# Patient Record
Sex: Female | Born: 2000
Health system: Southern US, Community
[De-identification: ages and names within clinical notes are randomized; demographics above are authoritative.]

## PROBLEM LIST (undated history)

## (undated) DIAGNOSIS — T50902A Poisoning by unspecified drugs, medicaments and biological substances, intentional self-harm, initial encounter: Secondary | ICD-10-CM

## (undated) DIAGNOSIS — T8859XA Other complications of anesthesia, initial encounter: Secondary | ICD-10-CM

## (undated) DIAGNOSIS — F32A Depression, unspecified: Secondary | ICD-10-CM

## (undated) DIAGNOSIS — F329 Major depressive disorder, single episode, unspecified: Secondary | ICD-10-CM

## (undated) DIAGNOSIS — I10 Essential (primary) hypertension: Secondary | ICD-10-CM

## (undated) HISTORY — PX: EYE SURGERY: SHX253

## (undated) HISTORY — DX: Poisoning by unspecified drugs, medicaments and biological substances, intentional self-harm, initial encounter: T50.902A

---
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
## 2019-01-01 ENCOUNTER — Other Ambulatory Visit: Payer: Self-pay | Admitting: Pediatrics

## 2019-01-02 NOTE — Telephone Encounter (Signed)
Patient needs a chest x-ray s/p positive Quantiferon Gold. Rx can not be refilled until x-ray is completed. Order is in so patient just needs to walk-in for x-ray. Left generic VM to call [facility] with a message from Dr. Konrad Dolores.

## 2019-01-03 ENCOUNTER — Other Ambulatory Visit: Payer: Self-pay | Admitting: Pediatrics

## 2019-01-03 DIAGNOSIS — F331 Major depressive disorder, recurrent, moderate: Secondary | ICD-10-CM

## 2019-01-03 NOTE — Telephone Encounter (Signed)
Second message left for patient to call [facility]. 

## 2019-01-06 NOTE — Telephone Encounter (Signed)
Contacted patient to remind her she needed a chest x-ray. Explained that order was on file and that she could walk in any time.

## 2019-01-09 NOTE — Telephone Encounter (Signed)
appt made for tomorrow. Patient also going to Xray before visit.

## 2019-01-10 ENCOUNTER — Ambulatory Visit
Admission: RE | Admit: 2019-01-10 | Discharge: 2019-01-10 | Disposition: A | Discharge status: Home / Self Care | Payer: Medicaid Other | Source: Ambulatory Visit | Attending: Pediatrics | Admitting: Pediatrics

## 2019-01-10 ENCOUNTER — Other Ambulatory Visit: Payer: Self-pay | Admitting: Pediatrics

## 2019-01-10 ENCOUNTER — Ambulatory Visit (INDEPENDENT_AMBULATORY_CARE_PROVIDER_SITE_OTHER): Payer: Medicaid Other | Admitting: Pediatrics

## 2019-01-10 ENCOUNTER — Other Ambulatory Visit: Payer: Self-pay

## 2019-01-10 ENCOUNTER — Encounter: Payer: Self-pay | Admitting: Pediatrics

## 2019-01-10 ENCOUNTER — Ambulatory Visit (INDEPENDENT_AMBULATORY_CARE_PROVIDER_SITE_OTHER): Payer: Medicaid Other | Admitting: Licensed Clinical Social Worker

## 2019-01-10 VITALS — Temp 98.3°F | Wt 134.4 lb

## 2019-01-10 DIAGNOSIS — R7611 Nonspecific reaction to tuberculin skin test without active tuberculosis: Secondary | ICD-10-CM

## 2019-01-10 DIAGNOSIS — F331 Major depressive disorder, recurrent, moderate: Secondary | ICD-10-CM | POA: Diagnosis not present

## 2019-01-10 DIAGNOSIS — F4310 Post-traumatic stress disorder, unspecified: Secondary | ICD-10-CM

## 2019-01-10 DIAGNOSIS — Z111 Encounter for screening for respiratory tuberculosis: Secondary | ICD-10-CM

## 2019-01-10 DIAGNOSIS — Z3202 Encounter for pregnancy test, result negative: Secondary | ICD-10-CM

## 2019-01-10 DIAGNOSIS — R0602 Shortness of breath: Secondary | ICD-10-CM | POA: Diagnosis not present

## 2019-01-10 LAB — POCT URINE PREGNANCY: Preg Test, Ur: NEGATIVE

## 2019-01-10 MED ORDER — DIVALPROEX SODIUM 500 MG PO DR TAB: 500.0000 mg | DELAYED_RELEASE_TABLET | Freq: Every day | ORAL | 0 refills | Status: DC

## 2019-01-10 NOTE — BH Specialist Note (Signed)
Integrated Behavioral Health Follow Up Visit  MRN: [id number] Name: Betty Logan  Number of Integrated Behavioral Health Clinician visits: 2/6 Session Start time: 2:17  Session End time: 2:24 Total time: 7 mins, no charge due to brief visit  Type of Service: Integrated Behavioral Health- Individual/Family Interpretor:No. Interpretor Name and Language: n/a  SUBJECTIVE: Betty Logan is a 18 y.o. female accompanied by Partner/Significant Other Patient was referred by Dr. Konrad Dolores for ongoing mood and behavior concerns. Patient reports the following symptoms/concerns: Pt reports interest in getting connected to [facility] for OPT and med mgmt Duration of problem: ongoing; Severity of problem: moderate  OBJECTIVE: Mood: Depressed, Euthymic and Irritable and Affect: Appropriate Risk of harm to self or others: No plan to harm self or others   GOALS ADDRESSED: 1.  Demonstrate ability to: Increase adequate support systems for patient/family  INTERVENTIONS: Interventions utilized:  Supportive Counseling and Link to [facility] Standardized Assessments completed: Not Needed  ASSESSMENT: Patient currently experiencing ongoing mood concerns, hx of counseling and med mgmt as effective.   Patient may benefit from keeping initial appt w/ [facility] for 01/13/2019 at noon.  PLAN: 1. Follow up with behavioral health clinician on : 02/10/2019 joint f/u w/ PCP 2. Behavioral recommendations: Pt will keep initial appt w/ [facility] to get established for counseling and med mgmt 3. Referral(s): Paramedic (LME/Outside Clinic); [facility], initial appt scheduled for 01/13/2019 4. "From scale of 1-10, how likely are you to follow plan?": Pt voiced understanding and agreement  [name], LPCA

## 2019-01-10 NOTE — Progress Notes (Signed)
Subjective:    Betty Logan is a 18 y.o. old female here with her boyfriend for follow-up positive TB test and anxiety/PTSD.    Alternate contact person for all health information including confidential results: cousin Betty Logan [phone] - ok to leave VM with confidential  HPI Betty Logan was seen for her annual PE on 11/25/18 and was restarted on depakote and prazosin which she had previously taken for anxiety and PTSD.  Patient reports that she has about 2-3 of the depakote left.  She thinks that she might have appointment upcoming with the [facility] soon but she isn't sure since her aunt was helping her set up those appointments.    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  Referred to health department for further testing.  Betty Logan reports that she has not received at call from the health department about her positive PPD test.      Shortness of breath - Doing better since the last visit.  Using albuterol and flovent prn.  Last used flovent last week for shortness of breath and feels like it helped. Does not have a spacer.  No cough.  Social history - Lives with boyfriend.  Previously staying with aunt and going to school.  Now looking for a job and not in school per patient  Review of Systems  Constitutional: Negative for activity change, appetite change and fever.  Respiratory: Positive for shortness of breath (much less than before). Negative for cough.     History and Problem List: Betty Logan has MDD (major depressive disorder), recurrent episode, moderate (HCC); Oppositional defiant disorder; Post traumatic stress disorder (PTSD); and Nightmares on their problem list.  Betty Logan  has a past medical history of Deliberate medication overdose (HCC) and Depression.     Objective:    Temp 98.3 F (36.8 C) (Temporal)   Wt 134 lb 6 oz (61 kg)   BMI 21.36 kg/m  Physical Exam Vitals signs reviewed.  HENT:     Mouth/Throat:     Mouth: Mucous membranes are moist.     Pharynx:  Oropharynx is clear.  Cardiovascular:     Rate and Rhythm: Normal rate and regular rhythm.     Heart sounds: Normal heart sounds.  Pulmonary:     Effort: Pulmonary effort is normal.     Breath sounds: Normal breath sounds. No wheezing, rhonchi or rales.  Neurological:     Mental Status: She is alert.        Assessment and Plan:   Betty Logan is a 18 y.o. old female with  1. Shortness of breath Improved.  Reviewed different between controller medication (Flovent) and rescue medication (Albuterol) for symptoms. Shortness of breath may have been due to asthma symptoms vs. Anxiety.  Pulmonary exam and chest x-ray are normal today.  2. Pregnancy examination or test, negative result Patient reports that she is taking the OCPs that were prescribed at the last visit. - POCT urine pregnancy  3. MDD (major depressive disorder) and PTSD Temporary 1 month supply of depakote provided to bridge her to her psychiatry appointment.  She has an intake appointment with the [facility] on 01/13/2019 but it is not clear when she will see psychiatry there for medication maanagement.  Discussed with patient that I will be able to provide only temporary Rx until she is able to establish with psychiatry.    - divalproex (DEPAKOTE) 500 MG DR tablet; Take 1 tablet (500 mg total) by mouth daily.  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  Positive quantiferon gold may be false positive vs. Indicative of latent TB.  Patient has not been contacted by the health department regarding this result.  Her phone only works on [employer] and she does not have the option to listen to voicemails.  She consents to have her cousin Betty Logan listed as an alternate contact - all health information may be shared with this cousin per patient. Betty Logan's phone number is [phone]     Return for recheck breathing and medications in 1 month with Dr. Konrad Dolores. Patient  may cancel appointment is she is able to establish with psychiatry for med management within the next month.  Clifton Custard, MD

## 2019-01-11 DIAGNOSIS — R0602 Shortness of breath: Secondary | ICD-10-CM | POA: Insufficient documentation

## 2019-01-13 ENCOUNTER — Telehealth: Payer: Self-pay

## 2019-01-13 NOTE — Telephone Encounter (Signed)
-----   Message from Clifton Custard, MD sent at 01/11/2019  9:08 AM EST ----- Patient gave cousin [name] [phone] as alternate contact for health department.  Ok to share all health information with [name] per patient.  Please contact health department with this new information.  Patient's chest x-ray was normal

## 2019-01-13 NOTE — Telephone Encounter (Signed)
Faxed cousin's name and number, along with copy of CXR result to TB clinic at [phone].

## 2019-01-27 ENCOUNTER — Telehealth: Payer: Self-pay

## 2019-01-27 NOTE — Telephone Encounter (Signed)
Betty Logan is returning a call related to treatment for TB. No record of outgoing call regarding this. Suspect call originated from [facility]. Betty Logan thinks this may be the case as well. Gave her number for TB clinic. She stated she would call the health department.

## 2019-02-10 ENCOUNTER — Encounter: Payer: Self-pay | Admitting: Licensed Clinical Social Worker

## 2019-02-10 ENCOUNTER — Ambulatory Visit: Payer: Medicaid Other | Admitting: Pediatrics

## 2019-02-24 ENCOUNTER — Other Ambulatory Visit: Payer: Self-pay | Admitting: Pediatrics

## 2019-02-24 ENCOUNTER — Telehealth: Payer: Self-pay | Admitting: Licensed Clinical Social Worker

## 2019-02-24 DIAGNOSIS — F331 Major depressive disorder, recurrent, moderate: Secondary | ICD-10-CM

## 2019-02-24 DIAGNOSIS — F4310 Post-traumatic stress disorder, unspecified: Secondary | ICD-10-CM

## 2019-02-24 NOTE — Progress Notes (Signed)
Will rout to correct pod, blue RX.  

## 2019-02-24 NOTE — Telephone Encounter (Signed)
[facility] called [facility] at request of PCP. No answer, no option to leave a voicemail.  [facility] called pt at request of PCP. Pt is requesting refills of depakote and inhaler. Pt reports having not gotten connected w/ [facility], is not interested in therapy. Pt agreed to doing virtual visits w/ [facility]. [facility] and pt scheduled test visit to determine pt's access.   Pt also reports being interested in coming to the clinic for a depo shot, does not want to take birth control pill.  [facility] to call pt 02/25/2019 in pm after discussing options w/ pt's PCP

## 2019-02-27 ENCOUNTER — Other Ambulatory Visit: Payer: Self-pay | Admitting: Pediatrics

## 2019-02-27 ENCOUNTER — Other Ambulatory Visit: Payer: Self-pay

## 2019-02-27 ENCOUNTER — Ambulatory Visit (INDEPENDENT_AMBULATORY_CARE_PROVIDER_SITE_OTHER): Payer: Medicaid Other | Admitting: Licensed Clinical Social Worker

## 2019-02-27 DIAGNOSIS — F331 Major depressive disorder, recurrent, moderate: Secondary | ICD-10-CM | POA: Diagnosis not present

## 2019-02-27 DIAGNOSIS — F4310 Post-traumatic stress disorder, unspecified: Secondary | ICD-10-CM

## 2019-02-27 DIAGNOSIS — Z79899 Other long term (current) drug therapy: Secondary | ICD-10-CM

## 2019-02-27 MED ORDER — MEDROXYPROGESTERONE ACETATE 150 MG/ML IM SUSP: 150.0000 mg | Freq: Once | INTRAMUSCULAR | Status: DC

## 2019-02-27 MED ORDER — DIVALPROEX SODIUM 500 MG PO DR TAB: 500.0000 mg | DELAYED_RELEASE_TABLET | Freq: Every day | ORAL | 0 refills | Status: DC

## 2019-02-27 NOTE — BH Specialist Note (Signed)
Integrated Behavioral Health via Telemedicine Video Visit  02/27/2019 Betty Logan [id number]  Session Start time: 3:39  Session End time: 4:20 Total time: 41 mins  Referring Provider: Dr. Konrad Dolores Type of Visit: Video Patient/Family location: Aunt's house [facility] Provider location: [facility] All persons participating in visit: Pt, [facility] and [name]  Confirmed patient's address: Yes  Confirmed patient's phone number: Yes  Any changes to demographics: No   Confirmed patient's insurance: Yes  Any changes to patient's insurance: No   Discussed confidentiality: Yes   I connected with Betty Logan by a video enabled telemedicine application and verified that I am speaking with the correct person using two identifiers.     I discussed the limitations of evaluation and management by telemedicine and the availability of in person appointments.  I discussed that the purpose of this visit is to provide behavioral health care while limiting exposure to the novel coronavirus.   Discussed there is a possibility of technology failure and discussed alternative modes of communication if that failure occurs.  I discussed that engaging in this video visit, they consent to the provision of behavioral healthcare and the services will be billed under their insurance.  Patient and/or legal guardian expressed understanding and consented to video visit: Yes   PRESENTING CONCERNS: Patient and/or family reports the following symptoms/concerns: Pt reports that the depacote has helped reduce her mood concerns and nightmares. Pt reports feeling more stable in her mood. Pt also reports feeling sad at the upcoming anniversary of her mother's death. Duration of problem: ongoing mood concerns; Severity of problem: moderate  STRENGTHS (Protective Factors/Coping Skills): Pt is insightful and aware of mood difficulties Pt as created supportive family network around her  GOALS ADDRESSED: Patient will: 1.   Increase knowledge and/or ability of: coping skills, healthy habits and self-management skills  2.  Demonstrate ability to: Increase healthy adjustment to current life circumstances  INTERVENTIONS: Interventions utilized:  Solution-Focused Strategies, Mindfulness or Management consultant, Supportive Counseling and Psychoeducation and/or Health Education Standardized Assessments completed: None at this time, PHQ-SADS indicated at follow up  ASSESSMENT: Patient currently experiencing ongoing mood concerns, as well as hx of nightmares. Pt also experiencing hx of mental health concerns, to include previous SI and time spent in [facility] and group homes. Pt denies any SI at this time. Pt experiencing the upcoming anniversary of her mother's death, is feeling sadness about losing mom.   Patient may benefit from ongoing support and coping skills from this clinic. Pt may also benefit from implementing coping strategies when emotions get overwhelmed. Pt may also benefit from following up w/ PCP and getting appropriate lab work done in order to maintain rx.  PLAN: 1. Follow up with behavioral health clinician on : 03/06/2019 2. Behavioral recommendations: Pt will take a walk or a hot shower when feeling upset. Pt will have labwork done in clinic 02/28/2019 3. Referral(s): Integrated [employer] (In Clinic)  I discussed the assessment and treatment plan with the patient and/or parent/guardian. They were provided an opportunity to ask questions and all were answered. They agreed with the plan and demonstrated an understanding of the instructions.   They were advised to call back or seek an in-person evaluation if the symptoms worsen or if the condition fails to improve as anticipated.  [name]

## 2019-02-28 ENCOUNTER — Ambulatory Visit: Payer: Medicaid Other

## 2019-02-28 NOTE — Progress Notes (Deleted)
Patient cancelled appointment.

## 2019-03-04 ENCOUNTER — Telehealth: Payer: Self-pay | Admitting: Licensed Clinical Social Worker

## 2019-03-04 NOTE — Telephone Encounter (Signed)
[facility] called pt to follow up on missed lab appt. Pt reports not having been able to find medicaid card for appt. [facility] and pt discussed community agencies to help support her in that. [facility] and pt spoke about rescheduling f/u [facility] appt. Appt changed to 03/05/2019, pt in agreement with no further questions or concerns.

## 2019-03-05 ENCOUNTER — Ambulatory Visit (INDEPENDENT_AMBULATORY_CARE_PROVIDER_SITE_OTHER): Payer: Medicaid Other | Admitting: Licensed Clinical Social Worker

## 2019-03-05 ENCOUNTER — Other Ambulatory Visit: Payer: Self-pay

## 2019-03-05 DIAGNOSIS — F331 Major depressive disorder, recurrent, moderate: Secondary | ICD-10-CM | POA: Diagnosis not present

## 2019-03-05 NOTE — BH Specialist Note (Signed)
Integrated Behavioral Health via Telemedicine Video Visit  03/05/2019 Betty Logan [id number]  Session Start time: 4:58  Session End time: 4:18 Total time: 20 minutes  Referring Provider: Dr. Konrad Logan Type of Visit: Video Patient/Family location: Sister's car [facility] Provider location: [facility] All persons participating in visit: Pt and [facility]  Confirmed patient's address: Yes  Confirmed patient's phone number: Yes  Any changes to demographics: No   Confirmed patient's insurance: Yes  Any changes to patient's insurance: No, pt reports having misplaced her medicaid card, a temporary card is in the mail.  Discussed confidentiality: Yes   I connected withShauntel J Logan by a video enabled telemedicine application and verified that I am speaking with the correct person using two identifiers.     I discussed the limitations of evaluation and management by telemedicine and the availability of in person appointments.  I discussed that the purpose of this visit is to provide behavioral health care while limiting exposure to the novel coronavirus.   Discussed there is a possibility of technology failure and discussed alternative modes of communication if that failure occurs.  I discussed that engaging in this video visit, they consent to the provision of behavioral healthcare and the services will be billed under their insurance.  Patient and/or legal guardian expressed understanding and consented to video visit: Yes   PRESENTING CONCERNS: Patient and/or family reports the following symptoms/concerns: Pt reports having recently gotten a job, feels excited and productive to start working. Pt reports spending more time with her sisters, feels supportive. Pt reports not thinking about upcoming anniversary of mom's death. Pt reports having cut her hair to help prevent her from pulling it out when anxious, feels positively about change Duration of problem: ongoing mood concerns; Severity of problem:  moderate  STRENGTHS (Protective Factors/Coping Skills): Insight into difficulties w/ mood Pt has created a supportive family network around her Pt starting a new job  GOALS ADDRESSED: Patient will: 1.  Increase knowledge and/or ability of: coping skills, healthy habits and self-management skills  2.  Demonstrate ability to: Increase healthy adjustment to current life circumstances  INTERVENTIONS: Interventions utilized:  Brief CBT, Supportive Counseling and Psychoeducation and/or Health Education Standardized Assessments completed: None at this time, PHQ-SADS indicated at follow up  ASSESSMENT: Patient currently experiencing ongoing mood concerns, as well as hx of nightmares. Pt w/ hx of mental health concerns; no SI at this time. Pt experiencing excitement and anticipation of starting a new job. Pt w/ hx of anxiety, has made changes to help reduce anxiety reactions.   Patient may benefit from ongoing support from this clinic. PT may also benefit from continuing to implement coping strategies when upset, as well as continue to spend time w/ supportive relationships.  PLAN: 1. Follow up with behavioral health clinician on : 03/12/2019 2. Behavioral recommendations: Pt will continue to spend time w/ sisters and will use coping skills when upset 3. Referral(s): Integrated [employer] (In Clinic)  I discussed the assessment and treatment plan with the patient and/or parent/guardian. They were provided an opportunity to ask questions and all were answered. They agreed with the plan and demonstrated an understanding of the instructions.   They were advised to call back or seek an in-person evaluation if the symptoms worsen or if the condition fails to improve as anticipated.  Betty Logan

## 2019-03-06 ENCOUNTER — Ambulatory Visit: Payer: Medicaid Other | Admitting: Licensed Clinical Social Worker

## 2019-03-12 ENCOUNTER — Ambulatory Visit: Payer: Medicaid Other | Admitting: Licensed Clinical Social Worker

## 2019-03-12 ENCOUNTER — Other Ambulatory Visit: Payer: Self-pay

## 2019-03-14 ENCOUNTER — Ambulatory Visit (HOSPITAL_COMMUNITY)
Admission: EM | Admit: 2019-03-14 | Discharge: 2019-03-14 | Disposition: A | Discharge status: Home / Self Care | Payer: Medicaid Other | Attending: Family Medicine | Admitting: Family Medicine

## 2019-03-14 ENCOUNTER — Ambulatory Visit (INDEPENDENT_AMBULATORY_CARE_PROVIDER_SITE_OTHER): Payer: Medicaid Other

## 2019-03-14 ENCOUNTER — Other Ambulatory Visit: Payer: Self-pay

## 2019-03-14 DIAGNOSIS — M79671 Pain in right foot: Secondary | ICD-10-CM

## 2019-03-14 MED ORDER — KETOROLAC TROMETHAMINE 30 MG/ML IJ SOLN
INTRAMUSCULAR | Status: AC
  Filled 2019-03-14: qty 1

## 2019-03-14 MED ORDER — KETOROLAC TROMETHAMINE 30 MG/ML IJ SOLN
30.0000 mg | Freq: Once | INTRAMUSCULAR | Status: AC
  Administered 2019-03-14: 13:00:00 30 mg via INTRAMUSCULAR
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
## 2019-03-14 NOTE — Discharge Instructions (Signed)
Your x ray was normal Post op shoe for comfort and healing Rest, ice, elevate and ibuprofen for pain as needed.  We gave you medication for pain here.  Follow up as needed for continued or worsening symptoms
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
## 2019-03-18 ENCOUNTER — Other Ambulatory Visit: Payer: Self-pay

## 2019-03-18 ENCOUNTER — Ambulatory Visit: Payer: Medicaid Other | Admitting: Licensed Clinical Social Worker

## 2019-03-19 ENCOUNTER — Telehealth: Payer: Self-pay | Admitting: Licensed Clinical Social Worker

## 2019-03-19 NOTE — Telephone Encounter (Signed)
LVM for parent regarding pre-screening for 5/14 visit. 

## 2019-03-20 ENCOUNTER — Ambulatory Visit: Payer: Self-pay | Admitting: Licensed Clinical Social Worker

## 2019-03-20 ENCOUNTER — Other Ambulatory Visit: Payer: Medicaid Other

## 2019-03-20 ENCOUNTER — Telehealth: Payer: Self-pay | Admitting: Licensed Clinical Social Worker

## 2019-03-20 NOTE — Telephone Encounter (Signed)
LVM for parent regarding pre-screening for 5/15 visit.

## 2019-03-21 ENCOUNTER — Ambulatory Visit (INDEPENDENT_AMBULATORY_CARE_PROVIDER_SITE_OTHER): Payer: Medicaid Other | Admitting: Pediatrics

## 2019-03-21 ENCOUNTER — Ambulatory Visit (INDEPENDENT_AMBULATORY_CARE_PROVIDER_SITE_OTHER): Payer: Medicaid Other | Admitting: Licensed Clinical Social Worker

## 2019-03-21 ENCOUNTER — Other Ambulatory Visit: Payer: Self-pay

## 2019-03-21 ENCOUNTER — Other Ambulatory Visit: Payer: Medicaid Other

## 2019-03-21 DIAGNOSIS — Z79899 Other long term (current) drug therapy: Secondary | ICD-10-CM

## 2019-03-21 DIAGNOSIS — S99921D Unspecified injury of right foot, subsequent encounter: Secondary | ICD-10-CM | POA: Diagnosis not present

## 2019-03-21 DIAGNOSIS — F331 Major depressive disorder, recurrent, moderate: Secondary | ICD-10-CM | POA: Diagnosis not present

## 2019-03-21 DIAGNOSIS — F4310 Post-traumatic stress disorder, unspecified: Secondary | ICD-10-CM

## 2019-03-21 DIAGNOSIS — Z30015 Encounter for initial prescription of vaginal ring hormonal contraceptive: Secondary | ICD-10-CM | POA: Diagnosis not present

## 2019-03-21 DIAGNOSIS — Z3202 Encounter for pregnancy test, result negative: Secondary | ICD-10-CM

## 2019-03-21 LAB — POCT URINE PREGNANCY: Preg Test, Ur: NEGATIVE

## 2019-03-21 MED ORDER — DIVALPROEX SODIUM 500 MG PO DR TAB: 500.0000 mg | DELAYED_RELEASE_TABLET | Freq: Every day | ORAL | 0 refills | Status: DC

## 2019-03-21 MED ORDER — MEDROXYPROGESTERONE ACETATE 150 MG/ML IM SUSP: 150.0000 mg | Freq: Once | INTRAMUSCULAR | Status: DC

## 2019-03-21 MED ORDER — ETONOGESTREL-ETHINYL ESTRADIOL 0.12-0.015 MG/24HR VA RING: VAGINAL_RING | VAGINAL | 12 refills | Status: DC

## 2019-03-21 NOTE — Progress Notes (Unsigned)
Patient came in for labs CBC with diff, Hepatic function panel and Valproic Acid level. Labs ordered by Silvestre Gunner. Successful collection.
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
## 2019-03-21 NOTE — BH Specialist Note (Signed)
Integrated Behavioral Health Follow Up Visit  MRN: [id number] Name: Betty Logan  Number of Integrated Behavioral Health Clinician visits: 5/6 Session Start time: 12:15  Session End time: 12:37 Total time: 22 mins  Type of Service: Integrated Behavioral Health- Individual/Family Interpretor:No. Interpretor Name and Language: n/a  SUBJECTIVE: Betty Logan is a 18 y.o. female accompanied by self Patient was referred by Dr. Konrad Dolores for ongoing mood and behavior concerns. Patient reports the following symptoms/concerns: Pt reports enjoying her new job. Pt also reports looking for her own place in order to gain custody of her sister. Pt reports being interested in contraception and that she has been out of Depacote for a while Duration of problem: ongoing; Severity of problem: moderate  OBJECTIVE: Mood: Euthymic and Irritable and Affect: Appropriate Risk of harm to self or others: No plan to harm self or others  LIFE CONTEXT: Family and Social: reports supportive family friends in the area; pt is seeking to gain custody of sister School/Work: Pt reports enjoying new job at [employer] Self-Care: Pt reports taking a hot shower is a good stress relief for her Life Changes: Covid 19, new job, seeking custody of sister  GOALS ADDRESSED: Patient will: 1.  Increase knowledge and/or ability of: coping skills  2.  Demonstrate ability to: Increase healthy adjustment to current life circumstances and Increase motivation to adhere to plan of care  INTERVENTIONS: Interventions utilized:  Solution-Focused Strategies, Supportive Counseling and Psychoeducation and/or Health Education Standardized Assessments completed: Not Needed  ASSESSMENT: Patient currently experiencing ongoing mood concerns, as well as hx of nightmares Pt w/ hx of mental health concerns, no SI at this time. Pt experiencing satisfaction with new job. Pt experiencing making changes to help reduce anxiety.   Patient may benefit  from ongoing support from this clinic. Pt may also benefit from keeping follow up visits as scheduled w/ PCP. Pt may also benefit from continuing to incorporate stress relief into her day, to include reaching out to supportive relationships.  PLAN: 1. Follow up with behavioral health clinician on : 03/25/2019 2. Behavioral recommendations: Pt will continue to practice healthy stress relief skills 3. Referral(s): Integrated Behavioral Health Services (In Clinic) 4. "From scale of 1-10, how likely are you to follow plan?": Pt voiced understanding and agreement  [name], [facility]

## 2019-03-22 LAB — CBC WITH DIFFERENTIAL/PLATELET
Absolute Monocytes: 468 cells/uL (ref 200–900)
Basophils Absolute: 11 cells/uL (ref 0–200)
Basophils Relative: 0.2 %
Eosinophils Absolute: 132 cells/uL (ref 15–500)
Eosinophils Relative: 2.4 %
HCT: 36.9 % (ref 34.0–46.0)
Hemoglobin: 12.6 g/dL (ref 11.5–15.3)
Lymphs Abs: 2552 cells/uL (ref 1200–5200)
MCH: 31.7 pg (ref 25.0–35.0)
MCHC: 34.1 g/dL (ref 31.0–36.0)
MCV: 92.9 fL (ref 78.0–98.0)
MPV: 10.7 fL (ref 7.5–12.5)
Monocytes Relative: 8.5 %
Neutro Abs: 2338 cells/uL (ref 1800–8000)
Neutrophils Relative %: 42.5 %
Platelets: 310 10*3/uL (ref 140–400)
RBC: 3.97 10*6/uL (ref 3.80–5.10)
RDW: 11.7 % (ref 11.0–15.0)
Total Lymphocyte: 46.4 %
WBC: 5.5 10*3/uL (ref 4.5–13.0)

## 2019-03-22 LAB — HEPATIC FUNCTION PANEL
AG Ratio: 1.8 (calc) (ref 1.0–2.5)
ALT: 9 U/L (ref 5–32)
AST: 12 U/L (ref 12–32)
Albumin: 4.5 g/dL (ref 3.6–5.1)
Alkaline phosphatase (APISO): 42 U/L (ref 36–128)
Bilirubin, Direct: 0.2 mg/dL (ref 0.0–0.2)
Globulin: 2.5 g/dL (calc) (ref 2.0–3.8)
Indirect Bilirubin: 0.5 mg/dL (calc) (ref 0.2–1.1)
Total Bilirubin: 0.7 mg/dL (ref 0.2–1.1)
Total Protein: 7 g/dL (ref 6.3–8.2)

## 2019-03-22 LAB — VALPROIC ACID LEVEL: Valproic Acid Lvl: <12.5 mg/L — ABNORMAL LOW (ref 50.0–100.0)

## 2019-03-25 ENCOUNTER — Other Ambulatory Visit: Payer: Self-pay | Admitting: Pediatrics

## 2019-03-25 ENCOUNTER — Ambulatory Visit: Payer: Self-pay | Admitting: Licensed Clinical Social Worker

## 2019-03-25 DIAGNOSIS — F331 Major depressive disorder, recurrent, moderate: Secondary | ICD-10-CM

## 2019-03-25 NOTE — Telephone Encounter (Signed)
Will rout to correct pod, blue RX.  

## 2019-03-28 ENCOUNTER — Ambulatory Visit (HOSPITAL_COMMUNITY)
Admission: EM | Admit: 2019-03-28 | Discharge: 2019-03-28 | Disposition: A | Discharge status: Home / Self Care | Payer: Medicaid Other | Attending: Internal Medicine | Admitting: Internal Medicine

## 2019-03-28 ENCOUNTER — Encounter (HOSPITAL_COMMUNITY): Payer: Self-pay | Admitting: Emergency Medicine

## 2019-03-28 ENCOUNTER — Other Ambulatory Visit: Payer: Self-pay

## 2019-03-28 DIAGNOSIS — J029 Acute pharyngitis, unspecified: Secondary | ICD-10-CM | POA: Diagnosis not present

## 2019-03-28 LAB — POCT RAPID STREP A: Streptococcus, Group A Screen (Direct): NEGATIVE

## 2019-03-28 NOTE — ED Triage Notes (Signed)
Pt presents to [facility] for assessment of headaches and chills for 2 days, today she woke up to a sore throat, painful whenever she swallows. States she had to leave work yesterday due to "high blood pressure".

## 2019-03-28 NOTE — ED Notes (Signed)
Patient able to ambulate independently  

## 2019-03-28 NOTE — Discharge Instructions (Signed)
Negative rapid strep.  Push fluids to ensure adequate hydration and keep secretions thin.  Throat lozenges, gargles, chloraseptic spray, warm teas, popsicles etc to help with throat pain.   With new onset of sore throat during covid-19 pandemic with others ill at your work we do recommend 7 days of self-isolation or until 3 days without fever or symptoms, whichever is longer.  If symptoms worsen or do not improve in the next week to return to be seen or to follow up with PCP.

## 2019-03-28 NOTE — ED Provider Notes (Signed)
[facility]    CSN: [id number] Arrival date & time: 03/28/19  1121     History   Chief Complaint Chief Complaint  Patient presents with  . Sore Throat    HPI Betty Logan is a 18 y.o. female.   Betty Logan presents with complaints of sore throat which started yesterday. States she had to leave work yesterday because she felt like she was going to pass out, her BP was elevated. Took her BP medication today, however. No further dizziness. No fevers. Mild runny nose. No cough. Hasn't taken any medications for symptoms. No chest pain  Or shortness of breath . No headache. Works for [employer]. States there was someone in her building who tested positive for covid-19 and left work two days ago. No other known ill contacts. Hx of depression, ptsd.    ROS per HPI, negative if not otherwise mentioned.      Past Medical History:  Diagnosis Date  . Deliberate medication overdose (HCC)   . Depression     Patient Active Problem List   Diagnosis Date Noted  . Shortness of breath 01/11/2019  . Nightmares 10/08/2014  . Oppositional defiant disorder 09/17/2014  . Post traumatic stress disorder (PTSD) 09/17/2014  . MDD (major depressive disorder), recurrent episode, moderate (HCC) 09/16/2014    Past Surgical History:  Procedure Laterality Date  . EYE SURGERY      OB History    Gravida  0   Para  0   Term  0   Preterm  0   AB  0   Living  0     SAB  0   TAB  0   Ectopic  0   Multiple  0   Live Births  0            Home Medications    Prior to Admission medications   Medication Sig Start Date End Date Taking? Authorizing Provider  PROVENTIL HFA 108 (90 Base) MCG/ACT inhaler INHALE 1 TO 2 PUFFS INTO THE LUNGS EVERY 6 (SIX) HOURS AS NEEDED FOR WHEEZING OR SHORTNESS OF BREATH. 03/28/19  Yes Lady Deutscher, MD  divalproex (DEPAKOTE) 500 MG DR tablet Take 1 tablet (500 mg total) by mouth daily. 03/21/19   Lady Deutscher, MD   etonogestrel-ethinyl estradiol (NUVARING) 0.12-0.015 MG/24HR vaginal ring Insert vaginally and leave in place for 3 consecutive weeks, then remove for 1 week. 03/21/19   Lady Deutscher, MD  FLOVENT HFA 110 MCG/ACT inhaler INHALE 2 PUFFS INTO THE LUNGS 2 (TWO) TIMES DAILY. 02/24/19   Lady Deutscher, MD  prazosin (MINIPRESS) 1 MG capsule TAKE 1 CAPSULE BY MOUTH AT BEDTIME. AFTER 3 NIGHTS, CAN INCREASE TO 2 CAPSULES (  TOTAL) AT BEDTIME 03/28/19   Lady Deutscher, MD  Prenat-FeCbn-FeAsp-Meth-FA-DHA (PRENATE MINI) 18-0.6-0.4-350 MG CAPS Take 1 tablet by mouth daily. 11/25/18   Lady Deutscher, MD  Prenatal Multivit-Min-Fe-FA (PRENATAL VITAMINS) 0.8 MG tablet Take 1 tablet by mouth daily. 11/25/18   Lady Deutscher, MD    Family History Family History  Problem Relation Age of Onset  . Hypertension Mother     Social History Social History   Tobacco Use  . Smoking status: Former Games developer  . Smokeless tobacco: Never Used  Substance Use Topics  . Alcohol use: Yes  . Drug use: Yes    Types: Marijuana     Allergies   Cherry   Review of Systems Review of Systems   Physical Exam Triage Vital Signs ED Triage Vitals  Enc Vitals Group     BP 03/28/19 1140 113/81     Pulse Rate 03/28/19 1140 74     Resp 03/28/19 1140 16     Temp 03/28/19 1140 98.3 F (36.8 C)     Temp Source 03/28/19 1140 Oral     SpO2 03/28/19 1140 100 %     Weight --      Height --      Head Circumference --      Peak Flow --      Pain Score 03/28/19 1141 3     Pain Loc --      Pain Edu? --      Excl. in GC? --    No data found.  Updated Vital Signs BP 113/81 (BP Location: Right Arm)   Pulse 74   Temp 98.3 F (36.8 C) (Oral)   Resp 16   LMP 03/25/2019   SpO2 100%    Physical Exam Constitutional:      General: She is not in acute distress.    Appearance: She is well-developed.  HENT:     Nose: No congestion or rhinorrhea.     Mouth/Throat:     Mouth: Mucous membranes are moist.     Tonsils: No  tonsillar exudate or tonsillar abscesses. 1+ on the right. 1+ on the left.  Eyes:     Conjunctiva/sclera: Conjunctivae normal.     Pupils: Pupils are equal, round, and reactive to light.  Cardiovascular:     Rate and Rhythm: Normal rate and regular rhythm.     Heart sounds: Normal heart sounds.  Pulmonary:     Effort: Pulmonary effort is normal.     Breath sounds: Normal breath sounds.  Lymphadenopathy:     Cervical: No cervical adenopathy.  Skin:    General: Skin is warm and dry.  Neurological:     Mental Status: She is alert and oriented to person, place, and time.      UC Treatments / Results  Labs (all labs ordered are listed, but only abnormal results are displayed) Labs Reviewed  CULTURE, GROUP A STREP [facility])  POCT RAPID STREP A    EKG None  Radiology No results found.  Procedures Procedures (including critical care time)  Medications Ordered in UC Medications - No data to display  Initial Impression / Assessment and Plan / UC Course  I have reviewed the triage vital signs and the nursing notes.  Pertinent labs & imaging results that were available during my care of the patient were reviewed by me and considered in my medical decision making (see chart for details).     Non toxic. Benign physical exam.  Negative rapid strep. Culture pending. In presence of covid-19 pandemic, with new onset sore throat,  do recommend  7 days of self isolation, as we do not have covid 19 testing at this facility. Return precautions provided. Patient verbalized understanding and agreeable to plan.   Final Clinical Impressions(s) / UC Diagnoses   Final diagnoses:  Pharyngitis, unspecified etiology     Discharge Instructions     Negative rapid strep.  Push fluids to ensure adequate hydration and keep secretions thin.  Throat lozenges, gargles, chloraseptic spray, warm teas, popsicles etc to help with throat pain.   With new onset of sore throat during covid-19 pandemic  with others ill at your work we do recommend 7 days of self-isolation or until 3 days without fever or symptoms, whichever is longer.  If symptoms worsen or  do not improve in the next week to return to be seen or to follow up with PCP.      ED Prescriptions    None     Controlled Substance Prescriptions [place] Controlled Substance Registry consulted? Not Applicable   Georgetta HaberBurky, Tyia Binford B, NP 03/28/19 1256

## 2019-03-30 LAB — CULTURE, GROUP A STREP (THRC)

## 2019-04-16 ENCOUNTER — Other Ambulatory Visit: Payer: Self-pay

## 2019-04-16 ENCOUNTER — Ambulatory Visit: Payer: Medicaid Other | Admitting: Pediatrics

## 2019-04-16 NOTE — Progress Notes (Deleted)
Virtual Visit via Video Note  I connected with Betty Logan on 04/16/19 at 11:15 AM EDT by a video enabled telemedicine application and verified that I am speaking with the correct person using two identifiers.   I discussed the limitations of evaluation and management by telemedicine and the availability of in person appointments. The patient expressed understanding and agreed to proceed.  History of Present Illness:    Observations/Objective:   Assessment and Plan:   Follow Up Instructions:    I discussed the assessment and treatment plan with the patient. The patient was provided an opportunity to ask questions and all were answered. The patient agreed with the plan and demonstrated an understanding of the instructions.   The patient was advised to call back or seek an in-person evaluation if the symptoms worsen or if the condition fails to improve as anticipated.  I provided *** minutes of non-face-to-face time during this encounter.   Marney Doctor, MD

## 2019-05-14 ENCOUNTER — Other Ambulatory Visit: Payer: Self-pay

## 2019-05-14 ENCOUNTER — Ambulatory Visit (INDEPENDENT_AMBULATORY_CARE_PROVIDER_SITE_OTHER): Payer: Medicaid Other

## 2019-05-14 ENCOUNTER — Telehealth: Payer: Self-pay | Admitting: Pediatrics

## 2019-05-14 VITALS — BP 98/66 | HR 80 | Ht 66.25 in | Wt 119.2 lb

## 2019-05-14 DIAGNOSIS — Z0001 Encounter for general adult medical examination with abnormal findings: Secondary | ICD-10-CM

## 2019-05-14 DIAGNOSIS — F4310 Post-traumatic stress disorder, unspecified: Secondary | ICD-10-CM | POA: Diagnosis not present

## 2019-05-14 DIAGNOSIS — F331 Major depressive disorder, recurrent, moderate: Secondary | ICD-10-CM

## 2019-05-14 DIAGNOSIS — Z68.41 Body mass index (BMI) pediatric, 5th percentile to less than 85th percentile for age: Secondary | ICD-10-CM | POA: Diagnosis not present

## 2019-05-14 DIAGNOSIS — R634 Abnormal weight loss: Secondary | ICD-10-CM

## 2019-05-14 DIAGNOSIS — Z7251 High risk heterosexual behavior: Secondary | ICD-10-CM | POA: Diagnosis not present

## 2019-05-14 DIAGNOSIS — Z3202 Encounter for pregnancy test, result negative: Secondary | ICD-10-CM | POA: Diagnosis not present

## 2019-05-14 DIAGNOSIS — Z3049 Encounter for surveillance of other contraceptives: Secondary | ICD-10-CM

## 2019-05-14 DIAGNOSIS — Z113 Encounter for screening for infections with a predominantly sexual mode of transmission: Secondary | ICD-10-CM

## 2019-05-14 DIAGNOSIS — Z8611 Personal history of tuberculosis: Secondary | ICD-10-CM

## 2019-05-14 DIAGNOSIS — J4540 Moderate persistent asthma, uncomplicated: Secondary | ICD-10-CM

## 2019-05-14 LAB — POCT URINE PREGNANCY: Preg Test, Ur: NEGATIVE

## 2019-05-14 LAB — POCT RAPID HIV: Rapid HIV, POC: NEGATIVE

## 2019-05-14 MED ORDER — PROVENTIL HFA 108 (90 BASE) MCG/ACT IN AERS: 1.0000 | INHALATION_SPRAY | RESPIRATORY_TRACT | 1 refills | Status: DC | PRN

## 2019-05-14 MED ORDER — MEDROXYPROGESTERONE ACETATE 150 MG/ML IM SUSP
150.0000 mg | Freq: Once | INTRAMUSCULAR | Status: AC
  Administered 2019-05-14: 150 mg via INTRAMUSCULAR

## 2019-05-14 MED ORDER — FLOVENT HFA 110 MCG/ACT IN AERO: INHALATION_SPRAY | RESPIRATORY_TRACT | 0 refills | Status: DC

## 2019-05-14 MED ORDER — DIVALPROEX SODIUM 500 MG PO DR TAB: 500.0000 mg | DELAYED_RELEASE_TABLET | Freq: Every day | ORAL | 2 refills | Status: DC
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
## 2019-05-14 NOTE — Telephone Encounter (Signed)

## 2019-05-14 NOTE — Patient Instructions (Signed)
Health Dep[facility]ment TB clinic: [phone]   Please call them and tell them that you had a positive TB blood test in January with normal chest x ray but that you need to be seen by them. Please let them know that you are having unintentional weight loss!  See [place] in 1 month to follow up on this.

## 2019-05-15 LAB — COMPLETE METABOLIC PANEL WITH GFR
AG Ratio: 2 (calc) (ref 1.0–2.5)
ALT: 6 U/L (ref 5–32)
AST: 8 U/L — ABNORMAL LOW (ref 12–32)
Albumin: 4.4 g/dL (ref 3.6–5.1)
Alkaline phosphatase (APISO): 45 U/L (ref 36–128)
BUN: 10 mg/dL (ref 7–20)
CO2: 22 mmol/L (ref 20–32)
Calcium: 9.5 mg/dL (ref 8.9–10.4)
Chloride: 109 mmol/L (ref 98–110)
Creat: 0.77 mg/dL (ref 0.50–1.00)
GFR, Est African American: 131 mL/min/{1.73_m2} (ref >=60)
GFR, Est Non African American: 113 mL/min/{1.73_m2} (ref >=60)
Globulin: 2.2 g/dL (calc) (ref 2.0–3.8)
Glucose, Bld: 90 mg/dL (ref 65–99)
Potassium: 4.6 mmol/L (ref 3.8–5.1)
Sodium: 139 mmol/L (ref 135–146)
Total Bilirubin: 0.3 mg/dL (ref 0.2–1.1)
Total Protein: 6.6 g/dL (ref 6.3–8.2)

## 2019-05-15 LAB — C. TRACHOMATIS/N. GONORRHOEAE RNA
C. trachomatis RNA, TMA: NOT DETECTED
N. gonorrhoeae RNA, TMA: NOT DETECTED

## 2019-05-15 LAB — TSH+FREE T4: TSH W/REFLEX TO FT4: 0.5 mIU/L

## 2019-05-16 LAB — CBC WITH DIFFERENTIAL/PLATELET
Absolute Monocytes: 302 cells/uL (ref 200–900)
Basophils Absolute: 18 cells/uL (ref 0–200)
Basophils Relative: 0.4 %
Eosinophils Absolute: 99 cells/uL (ref 15–500)
Eosinophils Relative: 2.2 %
HCT: 36.9 % (ref 34.0–46.0)
Hemoglobin: 12.6 g/dL (ref 11.5–15.3)
Lymphs Abs: 1881 cells/uL (ref 1200–5200)
MCH: 30.9 pg (ref 25.0–35.0)
MCHC: 34.1 g/dL (ref 31.0–36.0)
MCV: 90.4 fL (ref 78.0–98.0)
MPV: 10.8 fL (ref 7.5–12.5)
Monocytes Relative: 6.7 %
Neutro Abs: 2201 cells/uL (ref 1800–8000)
Neutrophils Relative %: 48.9 %
Platelets: 260 10*3/uL (ref 140–400)
RBC: 4.08 10*6/uL (ref 3.80–5.10)
RDW: 11.7 % (ref 11.0–15.0)
Total Lymphocyte: 41.8 %
WBC: 4.5 10*3/uL (ref 4.5–13.0)

## 2019-05-16 LAB — TEST AUTHORIZATION

## 2019-05-16 LAB — HIV ANTIBODY (ROUTINE TESTING W REFLEX): HIV 1&2 Ab, 4th Generation: NONREACTIVE

## 2019-05-16 LAB — RPR: RPR Ser Ql: NONREACTIVE

## 2019-05-16 LAB — QUANTIFERON-TB GOLD PLUS
Mitogen-NIL: 7.62 IU/mL
NIL: 0.07 IU/mL
QuantiFERON-TB Gold Plus: NEGATIVE
TB1-NIL: 0.03 IU/mL
TB2-NIL: 0 IU/mL

## 2019-05-16 LAB — VALPROIC ACID LEVEL

## 2019-06-06 ENCOUNTER — Other Ambulatory Visit: Payer: Self-pay | Admitting: Pediatrics

## 2019-06-06 DIAGNOSIS — F331 Major depressive disorder, recurrent, moderate: Secondary | ICD-10-CM

## 2019-06-06 NOTE — Telephone Encounter (Signed)
Will forward to correct pod, blue Rx.  

## 2019-06-18 ENCOUNTER — Other Ambulatory Visit: Payer: Self-pay

## 2019-06-18 ENCOUNTER — Ambulatory Visit (INDEPENDENT_AMBULATORY_CARE_PROVIDER_SITE_OTHER): Payer: Medicaid Other

## 2019-06-18 VITALS — BP 104/70 | Ht 66.25 in | Wt 113.8 lb

## 2019-06-18 DIAGNOSIS — F331 Major depressive disorder, recurrent, moderate: Secondary | ICD-10-CM

## 2019-06-18 DIAGNOSIS — Z227 Latent tuberculosis: Secondary | ICD-10-CM | POA: Diagnosis not present

## 2019-06-18 DIAGNOSIS — R634 Abnormal weight loss: Secondary | ICD-10-CM

## 2019-06-18 HISTORY — DX: Latent tuberculosis: Z22.7

## 2019-06-18 NOTE — Patient Instructions (Signed)
Remember to bring your medicines with you next appointment.

## 2019-06-18 NOTE — Progress Notes (Signed)
History was provided by the patient.  Betty Logan is a 18 y.o. female who is here for follow up.     HPI:   18 yo female presenting for follow up for unintentional weight loss. Weight has continued to downtrend for the past year (has lost 27 lbs since July 2019).   Is current being treated for latent TB infection by health department (+ quant gold 6 months ago, CXR negative). Did not follow up with health department for treatment after initial evaluation, just went two weeks ago and is now taking 3 weekly medications, unsure of names. Reports that she is taking medication every Wednesday.   No cough, night sweats, myalgias, bone pain, joint aches. No congestion, diarrhea, constipation. Feeling well overall. No jitteriness. No cold or heat intolerance.  Diet- does not eat anything fresh (no veggies, fruits). Eating pizza and church's chicken. Eating two meals a day. Does not eat breakfast. Reports having access to food at aunt's house but not wanting to eat a lot of foods.   Mood- has been "okay". No HI/SI or thoughts of self harm. Thinks that depression "may be contributing to weight loss" but when asked about mood "she is fine." Does not want to expound upon any statements. Has not taken depakote in 1 month.  Sexually active with same partner as last visit 1 month ago. No new partners. Not using condoms.    Patient Active Problem List   Diagnosis Date Noted  . Shortness of breath 01/11/2019  . Nightmares 10/08/2014  . Oppositional defiant disorder 09/17/2014  . Post traumatic stress disorder (PTSD) 09/17/2014  . MDD (major depressive disorder), recurrent episode, moderate ([place]) 09/16/2014    Current Outpatient Medications on File Prior to Visit  Medication Sig Dispense Refill  . divalproex (DEPAKOTE) 500 MG DR tablet Take 1 tablet (500 mg total) by mouth daily. 90 tablet 2  . fluticasone (FLOVENT HFA) 110 MCG/ACT inhaler INHALE 2 PUFFS INTO THE LUNGS 2 (TWO) TIMES DAILY. 1  Inhaler 0  . prazosin (MINIPRESS) 1 MG capsule TAKE 1 CAPSULE BY MOUTH AT BEDTIME. AFTER 3 NIGHTS, CAN INCREASE TO 2 CAPSULES (2MG  TOTAL) AT BEDTIME 60 capsule 1  . PROVENTIL HFA 108 (90 Base) MCG/ACT inhaler Inhale 1-2 puffs into the lungs every 4 (four) hours as needed for wheezing or shortness of breath. 6.7 g 1  . Prenat-FeCbn-FeAsp-Meth-FA-DHA (PRENATE MINI) 18-0.6-0.4-350 MG CAPS Take 1 tablet by mouth daily. (Patient not taking: Reported on 05/14/2019) 90 capsule 3  . Prenatal Multivit-Min-Fe-FA (PRENATAL VITAMINS) 0.8 MG tablet Take 1 tablet by mouth daily. (Patient not taking: Reported on 05/14/2019) 32 tablet 12   No current facility-administered medications on file prior to visit.     The following portions of the patient's history were reviewed and updated as appropriate: allergies, current medications, past family history, past medical history, past social history, past surgical history and problem list.  Physical Exam:    Vitals:   06/18/19 1122  BP: 104/70  Weight: 113 lb 12.8 oz (51.6 kg)  Height: 5' 6.25" (1.683 m)   Growth parameters are noted and are not appropriate for age. Blood pressure percentiles are not available for patients who are 18 years or older. Patient's last menstrual period was 06/11/2019 (within days).    General:   alert  Gait:   normal  Skin:   normal  Oral cavity:   lips, mucosa, and tongue normal; teeth and gums normal  Eyes:   sclerae white, pupils equal and reactive  Lymph:   ~1 cm inguinal node on R side. 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 Workup this far has been non-revealing with CBC, CMP WNL, HIV, RPR negative. TSH was also WNL in January. Quant gold was positive at that time and CXR was negative so patient was sent to health department for treatment for latent TB. Patient went to initial appointment but did not follow up for treatment. She just went 2 weeks ago after our last visit 1 month ago. This weight loss could be related to TB, although she has no other symptoms, but concerning that she has had significant 27 lb weight loss and drop in BMI from 56% to 8%. Will do screens for anxiety and depression at next appointment and re-evaluate treatment options as she has not been taking depakote (prescribed by psychiatrist but has not seen in 1 year).  1. Weight loss- full workup has been unrevealing. CBC  - TSH + free T4 - PHQ/SADS and EAT-26 next appointment - consider home scale to monitor weight at home  - consider repeat CBC with uric acid, LDH at next appointment   2. MDD (major depressive disorder), recurrent episode, moderate (HCC) - not currently taking medication. Refilled prescription for depakote 500 mg BID last appointment but has not picked up medication. Can take advantage of this break in medication to re-evaluate and possibly start new regimen as she is no longer seeing psychiatrist who initially prescribed the depakote - PHQ/SADS and EAT-26 next appointment - has taken zoloft 5 0mg  qd and prazosin 2 mg qhs in the past  3. 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

## 2019-06-19 LAB — TSH+FREE T4: TSH W/REFLEX TO FT4: 1.05 mIU/L

## 2019-07-09 ENCOUNTER — Telehealth: Payer: Self-pay | Admitting: Pediatrics

## 2019-07-09 ENCOUNTER — Ambulatory Visit: Payer: Medicaid Other | Admitting: Pediatrics

## 2019-07-09 ENCOUNTER — Encounter: Payer: Self-pay | Admitting: Licensed Clinical Social Worker

## 2019-07-09 NOTE — Telephone Encounter (Signed)
Spoke with patient as she missed appointment and has been having ongoing weight loss. Reports that she had something to do this morning and forgot about the appointment. She will call back to reschedule appointment ASAP.  Reports that she feels like her weight has gotten better (has not actually weighed herself). No fevers, night sweats, cough, rhinorrhea, fatigue, myalgias, arthralgias). Sounds happier on the phone than she did in clinic.

## 2019-07-10 ENCOUNTER — Encounter: Payer: Self-pay | Admitting: Clinical

## 2019-07-10 NOTE — Telephone Encounter (Signed)
A user error has taken place: encounter opened in error, closed for administrative reasons.

## 2019-08-03 ENCOUNTER — Other Ambulatory Visit: Payer: Self-pay

## 2019-08-03 ENCOUNTER — Encounter (HOSPITAL_COMMUNITY): Payer: Self-pay | Admitting: Emergency Medicine

## 2019-08-03 ENCOUNTER — Ambulatory Visit (HOSPITAL_COMMUNITY)
Admission: EM | Admit: 2019-08-03 | Discharge: 2019-08-03 | Disposition: A | Discharge status: Home / Self Care | Payer: Medicaid Other | Attending: Urgent Care | Admitting: Urgent Care

## 2019-08-03 DIAGNOSIS — Z0489 Encounter for examination and observation for other specified reasons: Secondary | ICD-10-CM

## 2019-08-03 HISTORY — DX: Essential (primary) hypertension: I10

## 2019-08-03 MED ORDER — CYCLOBENZAPRINE HCL 5 MG PO TABS: 5.0000 mg | ORAL_TABLET | Freq: Every evening | ORAL | 0 refills | Status: DC | PRN

## 2019-08-03 MED ORDER — NAPROXEN 500 MG PO TABS: 500.0000 mg | ORAL_TABLET | Freq: Two times a day (BID) | ORAL | 0 refills | Status: DC
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
## 2019-08-04 ENCOUNTER — Emergency Department (HOSPITAL_COMMUNITY)
Admission: EM | Admit: 2019-08-04 | Discharge: 2019-08-04 | Disposition: A | Discharge status: Home / Self Care | Payer: Medicaid Other | Attending: Emergency Medicine | Admitting: Emergency Medicine

## 2019-08-04 ENCOUNTER — Other Ambulatory Visit: Payer: Self-pay

## 2019-08-04 ENCOUNTER — Encounter (HOSPITAL_COMMUNITY): Payer: Self-pay | Admitting: Emergency Medicine

## 2019-08-04 DIAGNOSIS — Y929 Unspecified place or not applicable: Secondary | ICD-10-CM | POA: Diagnosis not present

## 2019-08-04 DIAGNOSIS — G44319 Acute post-traumatic headache, not intractable: Secondary | ICD-10-CM | POA: Diagnosis not present

## 2019-08-04 DIAGNOSIS — Y939 Activity, unspecified: Secondary | ICD-10-CM | POA: Insufficient documentation

## 2019-08-04 DIAGNOSIS — F1210 Cannabis abuse, uncomplicated: Secondary | ICD-10-CM | POA: Diagnosis not present

## 2019-08-04 DIAGNOSIS — I10 Essential (primary) hypertension: Secondary | ICD-10-CM | POA: Diagnosis not present

## 2019-08-04 DIAGNOSIS — S0990XA Unspecified injury of head, initial encounter: Secondary | ICD-10-CM | POA: Diagnosis present

## 2019-08-04 DIAGNOSIS — Y999 Unspecified external cause status: Secondary | ICD-10-CM | POA: Diagnosis not present

## 2019-08-04 DIAGNOSIS — Z79899 Other long term (current) drug therapy: Secondary | ICD-10-CM | POA: Insufficient documentation

## 2019-08-04 DIAGNOSIS — Z87891 Personal history of nicotine dependence: Secondary | ICD-10-CM | POA: Diagnosis not present

## 2019-08-04 MED ORDER — IBUPROFEN 800 MG PO TABS
800.0000 mg | ORAL_TABLET | Freq: Once | ORAL | Status: AC
  Administered 2019-08-04: 12:00:00 800 mg via ORAL
  Filled 2019-08-04: qty 1
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
## 2019-08-04 NOTE — Discharge Instructions (Addendum)
Take the medicines were given yesterday to help with your symptoms. Return to the emergency room if you develop vision loss, difficulty with your words, difficulty walking, nausea, vomiting, numbness, any new, worsening, concerning symptoms.
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
## 2019-08-06 ENCOUNTER — Other Ambulatory Visit: Payer: Self-pay | Admitting: Pediatrics

## 2019-08-06 DIAGNOSIS — F4310 Post-traumatic stress disorder, unspecified: Secondary | ICD-10-CM

## 2019-08-06 DIAGNOSIS — F331 Major depressive disorder, recurrent, moderate: Secondary | ICD-10-CM

## 2019-08-06 DIAGNOSIS — J4540 Moderate persistent asthma, uncomplicated: Secondary | ICD-10-CM

## 2019-08-07 ENCOUNTER — Other Ambulatory Visit: Payer: Self-pay | Admitting: Pediatrics

## 2019-08-07 NOTE — Progress Notes (Signed)
Patient requires another visit by provider to refill medications.

## 2019-08-07 NOTE — Telephone Encounter (Signed)
Patient must be seen in clinic by whomever is willing to prescribe. She has seen both myself, Dr Ginette Pitman, and Lucia Gaskins.

## 2019-08-07 NOTE — Progress Notes (Signed)
Attempted to contact patient to explain need for office visit prior to next refill. Generic VM left to call [place] for message from the doctor.

## 2019-08-07 NOTE — Telephone Encounter (Signed)
Attempted to contact patient. Generic VM left for patient to call [place].

## 2019-09-26 ENCOUNTER — Telehealth: Payer: Self-pay | Admitting: *Deleted

## 2019-09-26 NOTE — Telephone Encounter (Signed)
Pre-screening for onsite visit  1. Who is bringing the patient to the visit? SELF  Informed only one adult can bring patient to the visit to limit possible exposure to COVID19 and facemasks must be worn while in the building by the patient (ages 2 and older) and adult.  2. Has the person bringing the patient or the patient been around anyone with suspected or confirmed COVID-19 in the last 14 days?  NO  3. Has the person bringing the patient or the patient been around anyone who has been tested for COVID-19 in the last 14 days? NO  4. Has the person bringing the patient or the patient had any of these symptoms in the last 14 days?  NO   Fever (temp 100 F or higher) Breathing problems Cough Sore throat Body aches Chills Vomiting Diarrhea   If all answers are negative, advise patient to call our office prior to your appointment if you or the patient develop any of the symptoms listed above.   If any answers are yes, cancel in-office visit and schedule the patient for a same day telehealth visit with a provider to discuss the next steps.  

## 2019-09-29 ENCOUNTER — Ambulatory Visit (INDEPENDENT_AMBULATORY_CARE_PROVIDER_SITE_OTHER): Payer: Medicaid Other | Admitting: Pediatrics

## 2019-09-29 ENCOUNTER — Encounter: Payer: Self-pay | Admitting: Pediatrics

## 2019-09-29 ENCOUNTER — Other Ambulatory Visit (HOSPITAL_COMMUNITY)
Admission: RE | Admit: 2019-09-29 | Discharge: 2019-09-29 | Disposition: A | Discharge status: Home / Self Care | Payer: Medicaid Other | Source: Ambulatory Visit | Attending: Pediatrics | Admitting: Pediatrics

## 2019-09-29 ENCOUNTER — Other Ambulatory Visit: Payer: Self-pay

## 2019-09-29 VITALS — BP 106/68 | HR 96 | Ht 66.0 in | Wt 120.2 lb

## 2019-09-29 DIAGNOSIS — Z3202 Encounter for pregnancy test, result negative: Secondary | ICD-10-CM

## 2019-09-29 DIAGNOSIS — Z23 Encounter for immunization: Secondary | ICD-10-CM

## 2019-09-29 DIAGNOSIS — Z0000 Encounter for general adult medical examination without abnormal findings: Secondary | ICD-10-CM | POA: Diagnosis not present

## 2019-09-29 DIAGNOSIS — Z113 Encounter for screening for infections with a predominantly sexual mode of transmission: Secondary | ICD-10-CM

## 2019-09-29 DIAGNOSIS — Z227 Latent tuberculosis: Secondary | ICD-10-CM

## 2019-09-29 DIAGNOSIS — Z30017 Encounter for initial prescription of implantable subdermal contraceptive: Secondary | ICD-10-CM | POA: Diagnosis not present

## 2019-09-29 DIAGNOSIS — J4540 Moderate persistent asthma, uncomplicated: Secondary | ICD-10-CM

## 2019-09-29 LAB — POCT RAPID HIV: Rapid HIV, POC: NEGATIVE

## 2019-09-29 LAB — POCT URINE PREGNANCY: Preg Test, Ur: NEGATIVE

## 2019-09-29 MED ORDER — ALBUTEROL SULFATE HFA 108 (90 BASE) MCG/ACT IN AERS: INHALATION_SPRAY | RESPIRATORY_TRACT | 2 refills | Status: DC
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
## 2019-09-30 LAB — URINE CYTOLOGY ANCILLARY ONLY
Chlamydia: NEGATIVE
Comment: NEGATIVE
Comment: NORMAL
Neisseria Gonorrhea: NEGATIVE

## 2019-10-06 ENCOUNTER — Other Ambulatory Visit: Payer: Self-pay | Admitting: Pediatrics

## 2019-10-06 DIAGNOSIS — F331 Major depressive disorder, recurrent, moderate: Secondary | ICD-10-CM

## 2019-10-06 NOTE — Telephone Encounter (Signed)
Will send to correct pool, blue Rx.

## 2019-10-24 ENCOUNTER — Ambulatory Visit (HOSPITAL_COMMUNITY)
Admission: EM | Admit: 2019-10-24 | Discharge: 2019-10-24 | Disposition: A | Discharge status: Home / Self Care | Payer: Medicaid Other | Attending: Physician Assistant | Admitting: Physician Assistant

## 2019-10-24 ENCOUNTER — Encounter (HOSPITAL_COMMUNITY): Payer: Self-pay | Admitting: Emergency Medicine

## 2019-10-24 ENCOUNTER — Other Ambulatory Visit: Payer: Self-pay

## 2019-10-24 DIAGNOSIS — J069 Acute upper respiratory infection, unspecified: Secondary | ICD-10-CM | POA: Diagnosis present

## 2019-10-24 DIAGNOSIS — H6593 Unspecified nonsuppurative otitis media, bilateral: Secondary | ICD-10-CM | POA: Diagnosis present

## 2019-10-24 DIAGNOSIS — Z0289 Encounter for other administrative examinations: Secondary | ICD-10-CM

## 2019-10-24 DIAGNOSIS — Z20828 Contact with and (suspected) exposure to other viral communicable diseases: Secondary | ICD-10-CM | POA: Insufficient documentation

## 2019-10-24 DIAGNOSIS — I10 Essential (primary) hypertension: Secondary | ICD-10-CM | POA: Insufficient documentation

## 2019-10-24 DIAGNOSIS — Z87891 Personal history of nicotine dependence: Secondary | ICD-10-CM | POA: Diagnosis not present

## 2019-10-24 DIAGNOSIS — Z8249 Family history of ischemic heart disease and other diseases of the circulatory system: Secondary | ICD-10-CM | POA: Insufficient documentation

## 2019-10-24 DIAGNOSIS — Z79899 Other long term (current) drug therapy: Secondary | ICD-10-CM | POA: Diagnosis not present

## 2019-10-24 MED ORDER — FLUTICASONE PROPIONATE 50 MCG/ACT NA SUSP: 1.0000 | Freq: Every day | NASAL | 0 refills | Status: DC

## 2019-10-24 NOTE — ED Provider Notes (Signed)
[facility]    CSN: [id number] Arrival date & time: 10/24/19  1715      History   Chief Complaint Chief Complaint  Patient presents with  . work note    HPI Betty Logan is a 18 y.o. female.   Patient reports to urgent care today for 3 days of runny nose, nasal congestion, mild headache, sore throat, upset stomach and mild cough. She reports these symptoms all started on Tuesday which required her to call out of work. She reports resolution of all symptoms with exception of continued sore throat at this time and needs to be cleared for work. She reports the cough was dry but felt like she needed to clear her throat. She has not utilized anything for her symptoms. She denies fever, chills, vomiting , diarrhea, loss of smell or taste.   We spoke about need for Covid testing in order to have her safely return to work.      Past Medical History:  Diagnosis Date  . Deliberate medication overdose (HCC)   . Depression   . Hypertension     Patient Active Problem List   Diagnosis Date Noted  . 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 Shortness of breath 01/11/2019  . Nightmares 10/08/2014  . Oppositional defiant disorder 09/17/2014  . Post traumatic stress disorder (PTSD) 09/17/2014  . MDD (major depressive disorder), recurrent episode, moderate (HCC) 09/16/2014    Past Surgical History:  Procedure Laterality Date  . EYE SURGERY      OB History    Gravida  0   Para  0   Term  0   Preterm  0   AB  0   Living  0     SAB  0   TAB  0   Ectopic  0   Multiple  0   Live Births  0            Home Medications    Prior to Admission medications   Medication Sig Start Date End Date Taking? Authorizing Provider  albuterol (VENTOLIN HFA) 108 (90 Base) MCG/ACT inhaler INHALE 1 TO 2 PUFFS BY MOUTH EVERY FOUR HOURS AS NEEDED FOR WHEEZING OR SHORTNESS OF BREATH 09/29/19   Lady Deutscher, MD  fluticasone [facility]) 50 MCG/ACT nasal spray Place 1 spray  into both nostrils daily. 10/24/19 11/23/19  Glendine Swetz, Veryl Speak, PA-C  fluticasone (FLOVENT HFA) 110 MCG/ACT inhaler INHALE 2 PUFFS INTO THE LUNGS 2 (TWO) TIMES DAILY. Patient not taking: Reported on 09/29/2019 05/14/19   Marca Ancona, MD  prazosin (MINIPRESS) 1 MG capsule TAKE 1 CAPSULE BY MOUTH AT BEDTIME. AFTER 3 NIGHTS, CAN INCREASE TO 2 CAPSULES (2MG  TOTAL) AT BEDTIME 10/06/19   10/08/19, MD    Family History Family History  Problem Relation Age of Onset  . Hypertension Mother     Social History Social History   Tobacco Use  . Smoking status: Former Lady Deutscher  . Smokeless tobacco: Never Used  Substance Use Topics  . Alcohol use: Yes  . Drug use: Yes    Types: Marijuana     Allergies   Cherry   Review of Systems Review of Systems  Constitutional: Negative for appetite change, chills, fatigue and fever.  HENT: Positive for congestion, postnasal drip, rhinorrhea, sneezing and sore throat. Negative for ear pain, hearing loss, sinus pressure and sinus pain.   Eyes: Positive for itching. Negative for pain and visual disturbance.  Respiratory: Positive for cough. Negative for shortness of breath.  Cardiovascular: Negative for chest pain and palpitations.  Gastrointestinal: Negative for abdominal pain, constipation, diarrhea, nausea and vomiting.  Genitourinary: Negative for dysuria and flank pain.  Musculoskeletal: Negative for arthralgias, back pain and myalgias.  Skin: Negative for color change and rash.  Neurological: Positive for headaches. Negative for syncope.  All other systems reviewed and are negative.    Physical Exam Triage Vital Signs ED Triage Vitals  Enc Vitals Group     BP 10/24/19 1725 119/81     Pulse Rate 10/24/19 1725 70     Resp 10/24/19 1725 12     Temp 10/24/19 1725 98.4 F (36.9 C)     Temp Source 10/24/19 1725 Oral     SpO2 10/24/19 1725 100 %     Weight --      Height --      Head Circumference --      Peak Flow --      Pain  Score 10/24/19 1724 0     Pain Loc --      Pain Edu? --      Excl. in [place]? --    No data found.  Updated Vital Signs BP 119/81 (BP Location: Right Arm)   Pulse 70   Temp 98.4 F (36.9 C) (Oral)   Resp 12   LMP 10/23/2019 (Exact Date)   SpO2 100%   Visual Acuity Right Eye Distance:   Left Eye Distance:   Bilateral Distance:    Right Eye Near:   Left Eye Near:    Bilateral Near:     Physical Exam Vitals and nursing note reviewed.  Constitutional:      General: She is not in acute distress.    Appearance: Normal appearance. She is well-developed and normal weight. She is not ill-appearing.  HENT:     Head: Normocephalic and atraumatic.     Right Ear: Tympanic membrane, ear canal and external ear normal.     Left Ear: Tympanic membrane, ear canal and external ear normal.     Nose: Rhinorrhea present. No congestion.     Mouth/Throat:     Mouth: Mucous membranes are moist.     Pharynx: Oropharynx is clear. No oropharyngeal exudate or posterior oropharyngeal erythema.  Eyes:     General: No scleral icterus.       Right eye: No discharge.        Left eye: No discharge.     Conjunctiva/sclera: Conjunctivae normal.     Pupils: Pupils are equal, round, and reactive to light.  Cardiovascular:     Rate and Rhythm: Normal rate and regular rhythm.     Heart sounds: No murmur. No friction rub. No gallop.   Pulmonary:     Effort: Pulmonary effort is normal. No respiratory distress.     Breath sounds: Normal breath sounds. No wheezing, rhonchi or rales.  Abdominal:     General: Abdomen is flat. Bowel sounds are normal.     Palpations: Abdomen is soft.     Tenderness: There is no abdominal tenderness.  Musculoskeletal:        General: Normal range of motion.     Cervical back: Neck supple.     Right lower leg: No edema.     Left lower leg: No edema.  Lymphadenopathy:     Cervical: No cervical adenopathy.  Skin:    General: Skin is warm and dry.     Capillary Refill:  Capillary refill takes less than 2 seconds.  Coloration: Skin is not jaundiced.     Findings: No rash.  Neurological:     General: No focal deficit present.     Mental Status: She is alert and oriented to person, place, and time.  Psychiatric:        Mood and Affect: Mood normal.        Behavior: Behavior normal.        Thought Content: Thought content normal.        Judgment: Judgment normal.      UC Treatments / Results  Labs (all labs ordered are listed, but only abnormal results are displayed) Labs Reviewed  NOVEL CORONAVIRUS, NAA (HOSP ORDER, SEND-OUT TO REF LAB; TAT 18-24 HRS)    EKG   Radiology No results found.  Procedures Procedures (including critical care time)  Medications Ordered in UC Medications - No data to display  Initial Impression / Assessment and Plan / UC Course  I have reviewed the triage vital signs and the nursing notes.  Pertinent labs & imaging results that were available during my care of the patient were reviewed by me and considered in my medical decision making (see chart for details).     #URI #serous otitis media - Viral syndrome. Discussed need for Covid testing to safely return to work. No symptoms now. Sent flonase to help with serous OM. Covid precautions and return precautions given.   Final Clinical Impressions(s) / UC Diagnoses   Final diagnoses:  Viral upper respiratory illness  Bilateral serous otitis media, unspecified chronicity     Discharge Instructions     If your Covid-19 test is positive, you will receive a [facility] regarding your results. Negative test results are not called. Both positive and negative results area always visible on MyChart. If you do not have a MyChart account, sign up instructions are in your discharge papers.   Persons who are directed to care for themselves at home may discontinue isolation under the following conditions:  . At least 10 days have passed since  symptom onset and . At least 24 hours have passed without running a fever (this means without the use of fever-reducing medications) and . Other symptoms have improved.  Persons infected with COVID-19 who never develop symptoms may discontinue isolation and other precautions 10 days after the date of their first positive COVID-19 test.   Use the flonase 1 time daily to help with ear fullness. Follow up with your primary care if you have worsening or no improvement in your symptoms.     ED Prescriptions    Medication Sig Dispense Auth. Provider   fluticasone (FLONASE) 50 MCG/ACT nasal spray Place 1 spray into both nostrils daily. 1 g Tag Wurtz, Veryl SpeakJacob E, PA-C     PDMP not reviewed this encounter.   Hermelinda Medicusarr, Zoriana Oats E, PA-C 10/24/19 1811

## 2019-10-24 NOTE — ED Triage Notes (Addendum)
Pt reports that three days ago she had a sore throat, nasal congestion and watery eyes.  Pt called sick into work.  Pt states the symptoms have since resolved and she is symptom free today.  Pt needs a note to go back to work.

## 2019-10-24 NOTE — Discharge Instructions (Addendum)
If your Covid-19 test is positive, you will receive a phone call from [facility] regarding your results. Negative test results are not called. Both positive and negative results area always visible on MyChart. If you do not have a MyChart account, sign up instructions are in your discharge papers.   Persons who are directed to care for themselves at home may discontinue isolation under the following conditions:   At least 10 days have passed since symptom onset and  At least 24 hours have passed without running a fever (this means without the use of fever-reducing medications) and  Other symptoms have improved.  Persons infected with COVID-19 who never develop symptoms may discontinue isolation and other precautions 10 days after the date of their first positive COVID-19 test.   Use the flonase 1 time daily to help with ear fullness. Follow up with your primary care if you have worsening or no improvement in your symptoms.

## 2019-10-26 IMAGING — DX DG CHEST 2V
2 series · 2 of 2 positions shown · non-contrast
Comparison: None.

CLINICAL DATA: Positive TB test.  No symptoms.

EXAM:
CHEST - 2 VIEW

[dg chest 2 view (1 of 2)]
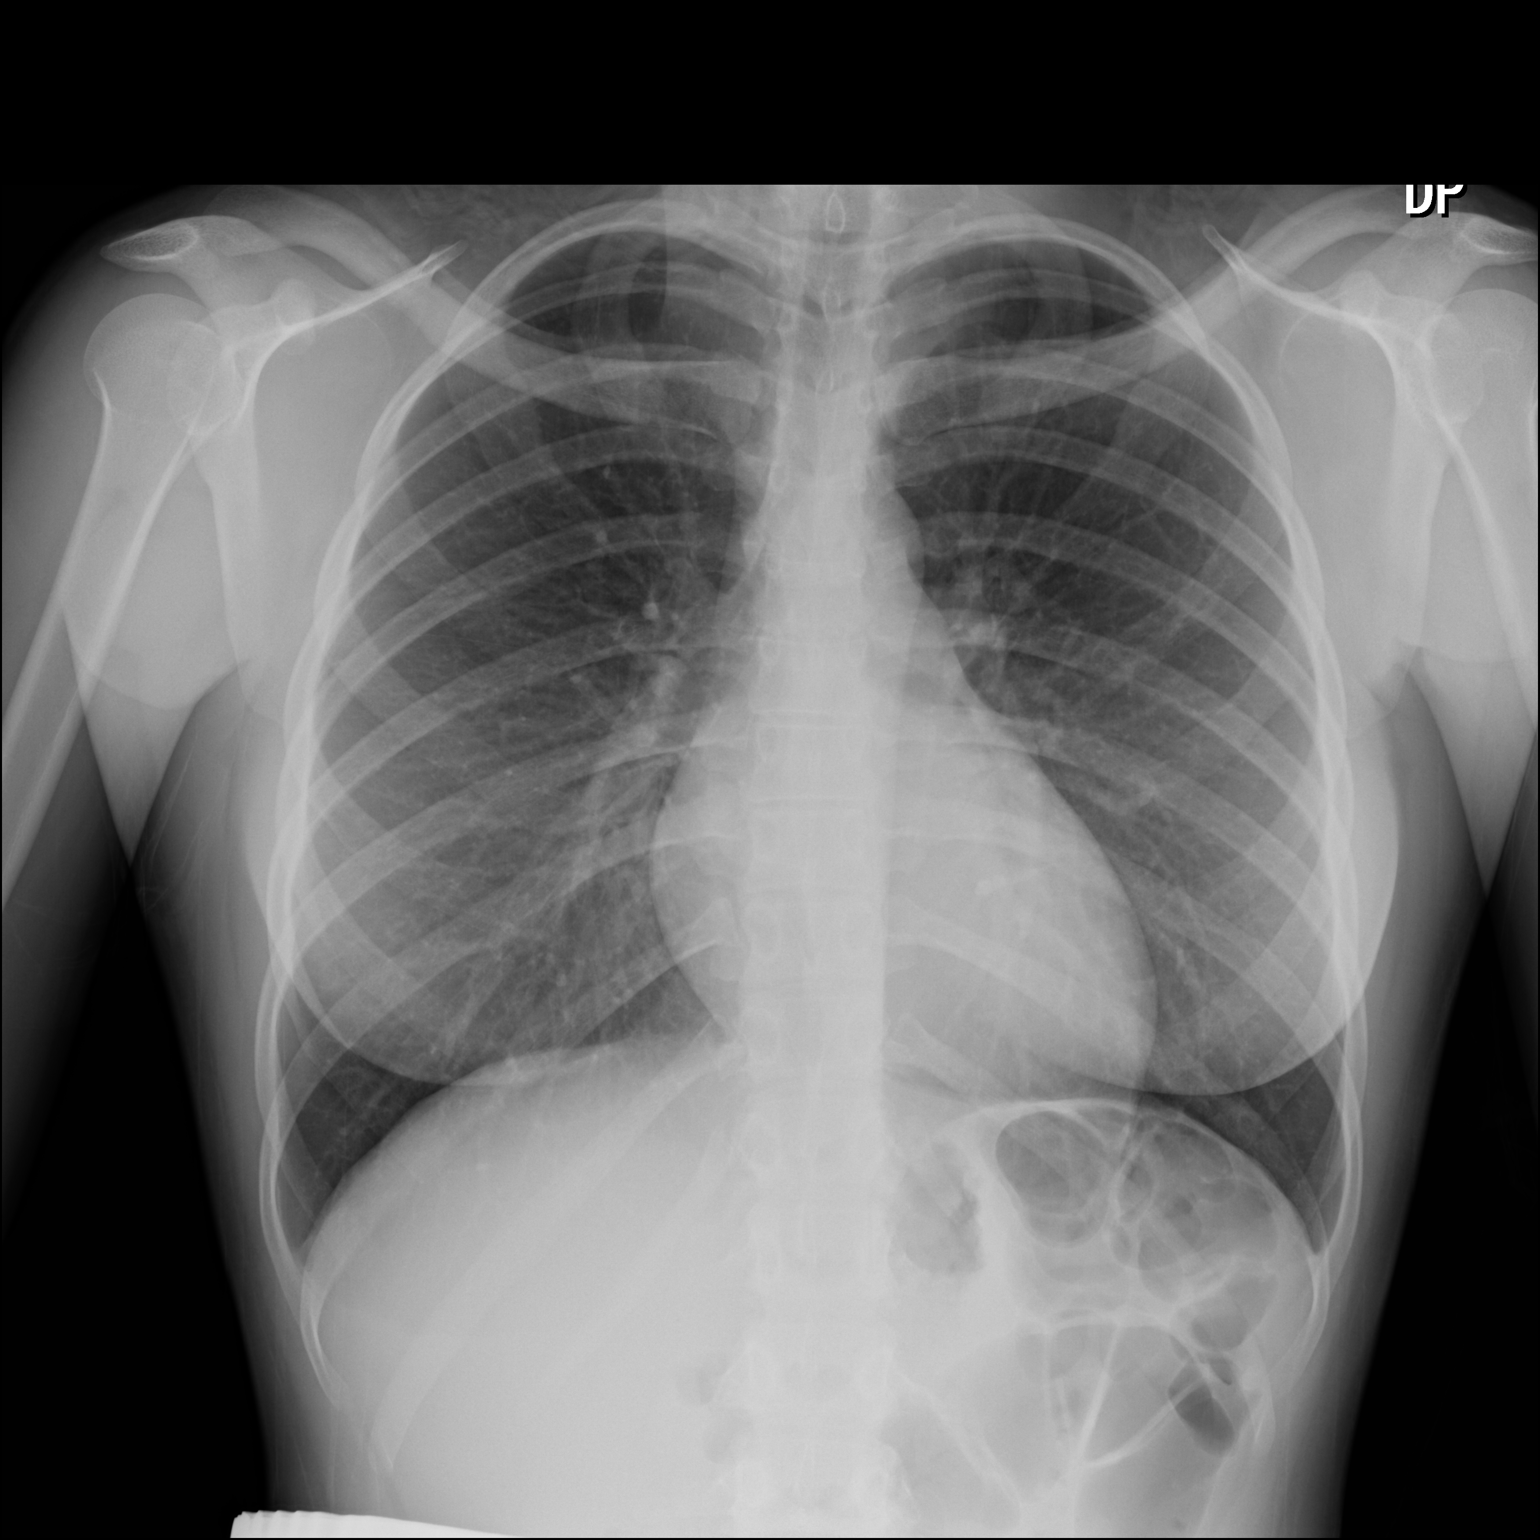

[dg chest 2 view (2 of 2)]
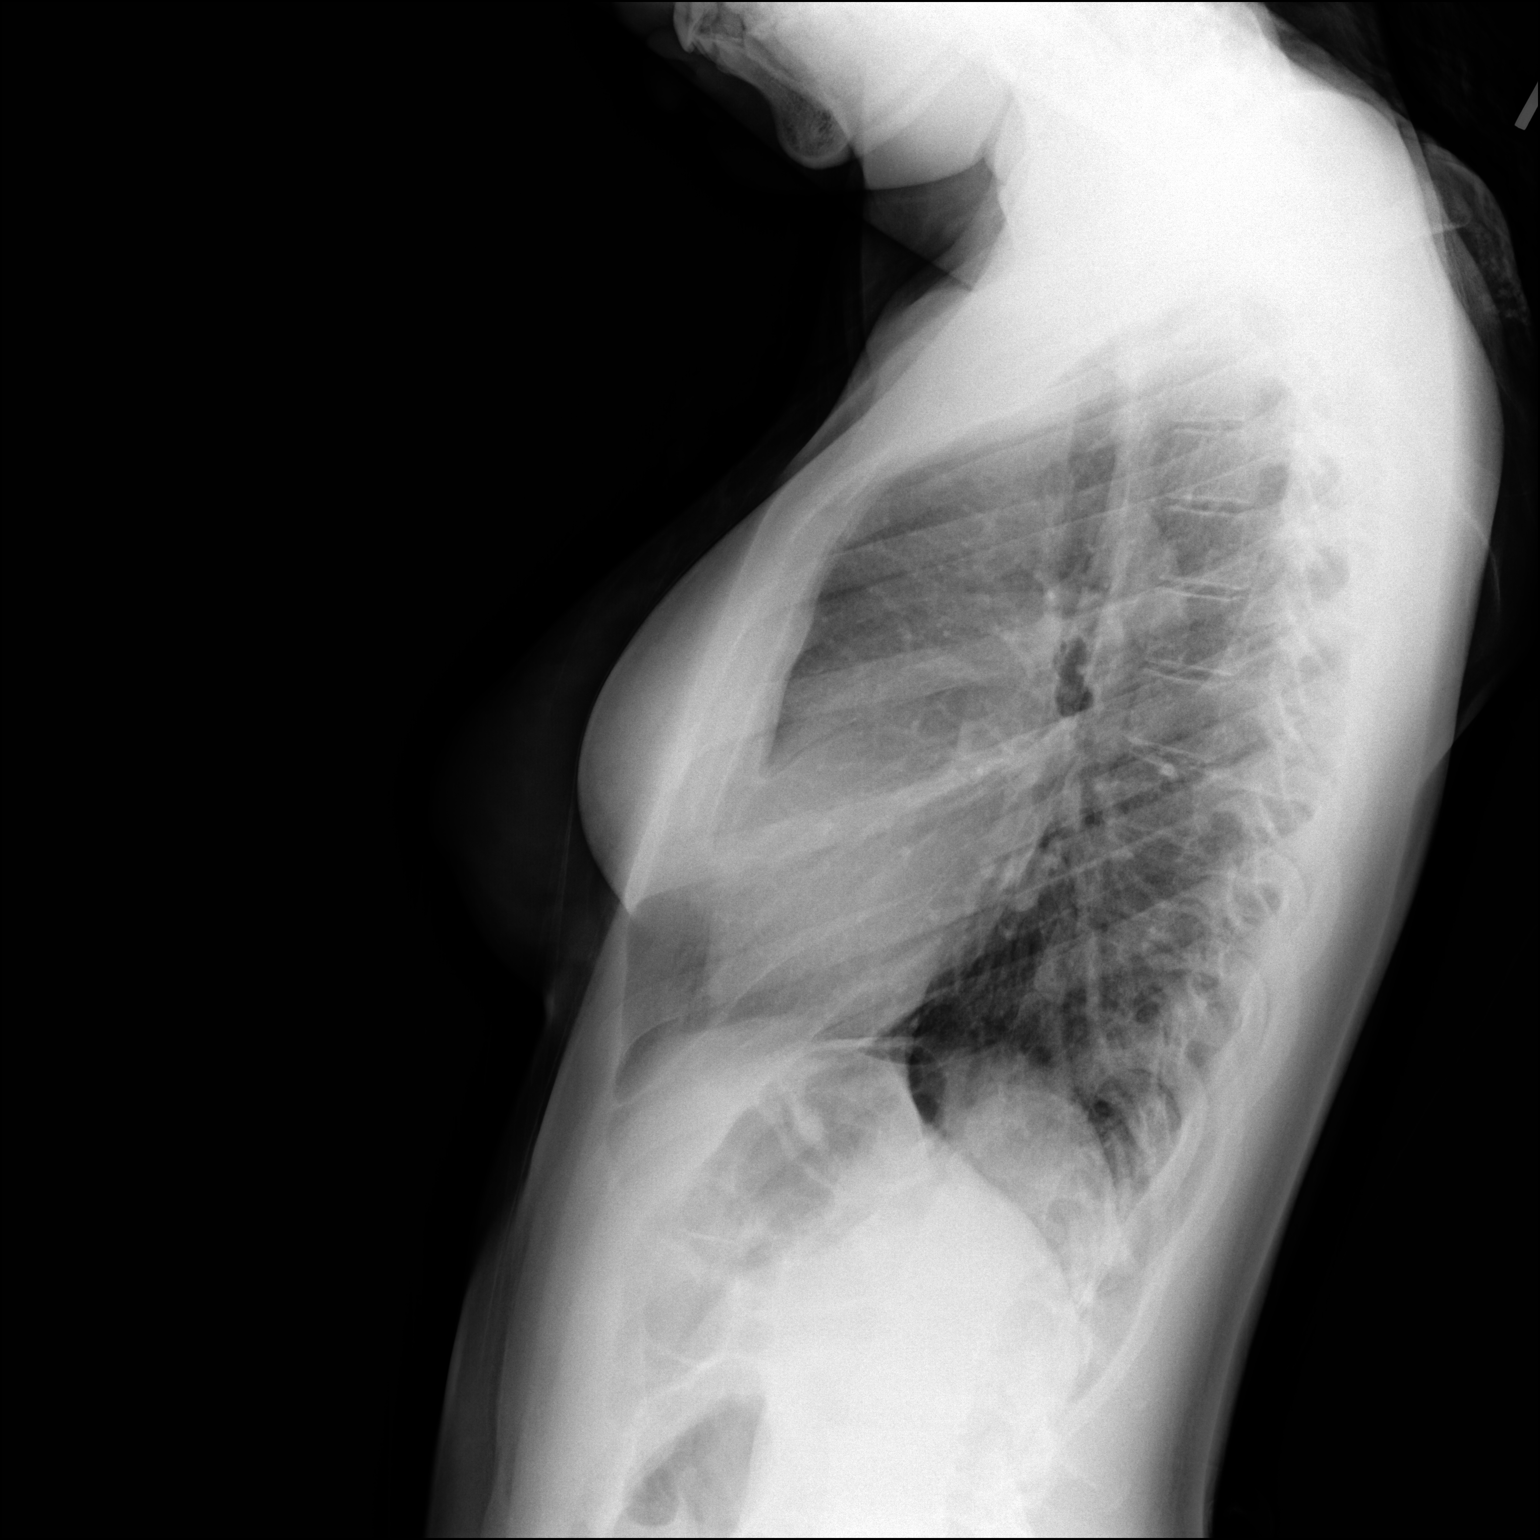

[2 of 2 positions shown; findings below may reference images not displayed]

FINDINGS: Normal heart, mediastinum and hila.

Lungs are clear and are symmetrically aerated.

No pleural effusion or pneumothorax.

Skeletal structures are within normal limits.
IMPRESSION: Normal chest radiographs.

## 2019-10-27 LAB — NOVEL CORONAVIRUS, NAA (HOSP ORDER, SEND-OUT TO REF LAB; TAT 18-24 HRS): SARS-CoV-2, NAA: NOT DETECTED

## 2019-12-16 ENCOUNTER — Telehealth: Payer: Self-pay | Admitting: Pediatrics

## 2019-12-16 DIAGNOSIS — J4540 Moderate persistent asthma, uncomplicated: Secondary | ICD-10-CM

## 2019-12-16 NOTE — Telephone Encounter (Signed)
Routing to correct pool.

## 2019-12-17 ENCOUNTER — Other Ambulatory Visit: Payer: Self-pay | Admitting: Pediatrics

## 2019-12-31 NOTE — Telephone Encounter (Signed)
Appointment scheduled.

## 2020-01-01 ENCOUNTER — Ambulatory Visit: Payer: Medicaid Other | Admitting: Pediatrics

## 2020-01-15 ENCOUNTER — Other Ambulatory Visit: Payer: Self-pay

## 2020-01-15 ENCOUNTER — Telehealth (INDEPENDENT_AMBULATORY_CARE_PROVIDER_SITE_OTHER): Payer: Medicaid Other | Admitting: Pediatrics

## 2020-01-15 DIAGNOSIS — J4550 Severe persistent asthma, uncomplicated: Secondary | ICD-10-CM

## 2020-01-15 DIAGNOSIS — J302 Other seasonal allergic rhinitis: Secondary | ICD-10-CM

## 2020-01-15 MED ORDER — BUDESONIDE-FORMOTEROL FUMARATE 80-4.5 MCG/ACT IN AERO: 2.0000 | INHALATION_SPRAY | Freq: Two times a day (BID) | RESPIRATORY_TRACT | 12 refills | Status: DC

## 2020-01-15 MED ORDER — FLUTICASONE PROPIONATE 50 MCG/ACT NA SUSP: 1.0000 | Freq: Every day | NASAL | 0 refills | Status: DC

## 2020-01-15 MED ORDER — SPACER/AERO-HOLDING CHAMBERS DEVI: 0 refills | Status: DC

## 2020-01-15 MED ORDER — CETIRIZINE HCL 10 MG PO TABS: 10.0000 mg | ORAL_TABLET | Freq: Every day | ORAL | 2 refills | Status: DC

## 2020-01-15 NOTE — Patient Instructions (Addendum)
Asthma Action Plan for Betty Logan  Printed: 01/15/2020 Doctor's Name: Lady Deutscher, MD, Phone Number: [phone]  Please bring this plan to each visit to our office or the emergency room.  GREEN ZONE: Doing Well  No cough, wheeze, chest tightness or shortness of breath during the day or night Can do your usual activities  Take these long-term-control medicines each day  Symbicort (budesonide/formoterol) 80/4.5 mcg inhaler Flonase (fluticasone) 1 spray per nostril daily Zyrtec (ceterizine) 10 mg daily  Take these medicines before exercise if your asthma is exercise-induced  Medicine How much to take When to take it  Symbicort (budesonide/formoterol) 1 puffs with a spacer 30 minutes before exercise   YELLOW ZONE: Asthma is Getting Worse  Cough, wheeze, chest tightness or shortness of breath or Waking at night due to asthma, or Can do some, but not all, usual activities  Take quick-relief medicine - and keep taking your GREEN ZONE medicines  Take the Symbicort (budesonide/formoterol) inhaler 1 puffs every 20 minutes for up to 1 hour with a spacer.   If your symptoms do not improve after 1 hour of above treatment, or if the Symbicort is not lasting 4 hours between treatments: Call your doctor to be seen    RED ZONE: Medical Alert!  Very short of breath, or Quick relief medications have not helped, or Cannot do usual activities, or Symptoms are same or worse after 24 hours in the Yellow Zone  First, take these medicines:  Take the Symbicort inhaler 1 puff every 5 minutes for up to 8 times with a spacer.  Then call your medical provider NOW! Go to the hospital or call an ambulance if: You are still in the Red Zone after 15 minutes, AND You have not reached your medical provider DANGER SIGNS  Trouble walking and talking due to shortness of breath, or Lips or fingernails are blue Take 1 puffs of your quick relief medicine with a spacer, AND Go to the hospital or  call for an ambulance (call 911) NOW!  Correct Use of MDI and Spacer with Mouthpiece Below are the steps for the correct use of a metered dose inhaler (MDI) and spacer with MOUTHPIECE.  Patient should perform the following steps: 1.  Shake the canister for 5 seconds. 2.  Prime the MDI. (Varies depending on MDI brand, see package insert.) In general: -If MDI not used in 2 weeks or has been dropped: spray 2 puffs into air -If MDI never used before spray 3 puffs into air 3.  Insert the MDI into the spacer. 4.  Place the spacer mouthpiece into your mouth between the teeth. 5.  Close your lips around the mouthpiece and exhale normally. 6.  Press down the top of the canister to release 1 puff of medicine. 7.  Inhale the medicine through the mouth deeply and slowly (3-5 seconds spacer whistles when breathing in too fast.  8.  Hold your breath for 10 seconds and remove the spacer from your mouth before exhaling. 9.  Wait one minute before giving another puff of the medication. 10.Caregiver supervises and advises in the process of medicatin administration with spacer.             11.Repeat steps 4 through 8 depending on how many puffs are indicated on the prescription. Cleaning Instructions 1. Remove the rubber end of spacer where the MDI fits. 2. Rotate spacer mouthpiece counter-clockwise and lift up to remove. 3. Lift the valve off the clear posts at the end of  the chamber. 4. Soak the parts in warm water with clear, liquid detergent for about 15 minutes. 5. Rinse in clean water and shake to remove excess water. 6. Allow all parts to air dry. DO NOT dry with a towel.  7. To reassemble, hold chamber upright and place valve over clear posts. Replace spacer mouthpiece and turn it clockwise until it locks into place. Replace the back rubber end onto the spacer.   For more information, go to http://bit.ly/[employer]sthmaEducation.    Allergic Rhinitis, Adult Allergic rhinitis is a reaction to allergens  in the air. Allergens are tiny specks (particles) in the air that cause your body to have an allergic reaction. This condition cannot be passed from person to person (is not contagious). Allergic rhinitis cannot be cured, but it can be controlled. There are two types of allergic rhinitis:  Seasonal. This type is also called hay fever. It happens only during certain times of the year.  Perennial. This type can happen at any time of the year. What are the causes? This condition may be caused by:  Pollen from grasses, trees, and weeds.  House dust mites.  Pet dander.  Mold. What are the signs or symptoms? Symptoms of this condition include:  Sneezing.  Runny or stuffy nose (nasal congestion).  A lot of mucus in the back of the throat (postnasal drip).  Itchy nose.  Tearing of the eyes.  Trouble sleeping.  Being sleepy during day. How is this treated? There is no cure for this condition. You should avoid things that trigger your symptoms (allergens). Treatment can help to relieve symptoms. This may include:  Medicines that block allergy symptoms, such as antihistamines. These may be given as a shot, nasal spray, or pill.  Shots that are given until your body becomes less sensitive to the allergen (desensitization).  Stronger medicines, if all other treatments have not worked. Follow these instructions at home: Avoiding allergens   Find out what you are allergic to. Common allergens include smoke, dust, and pollen.  Avoid them if you can. These are some of the things that you can do to avoid allergens: ? Replace carpet with wood, tile, or vinyl flooring. Carpet can trap dander and dust. ? Clean any mold found in the home. ? Do not smoke. Do not allow smoking in your home. ? Change your heating and air conditioning filter at least once a month. ? During allergy season:  Keep windows closed as much as you can. If possible, use air conditioning when there is a lot of  pollen in the air.  Use a special filter for allergies with your furnace and air conditioner.  Plan outdoor activities when pollen counts are lowest. This is usually during the early morning or evening hours.  If you do go outdoors when pollen count is high, wear a special mask for people with allergies.  When you come indoors, take a shower and change your clothes before sitting on furniture or bedding. General instructions  Do not use fans in your home.  Do not hang clothes outside to dry.  Wear sunglasses to keep pollen out of your eyes.  Wash your hands right away after you touch household pets.  Take over-the-counter and prescription medicines only as told by your doctor.  Keep all follow-up visits as told by your doctor. This is important. Contact a doctor if:  You have a fever.  You have a cough that does not go away (is persistent).  You start to  make whistling sounds when you breathe (wheeze).  Your symptoms do not get better with treatment.  You have thick fluid coming from your nose.  You start to have nosebleeds. Get help right away if:  Your tongue or your lips are swollen.  You have trouble breathing.  You feel dizzy or you feel like you are going to pass out (faint).  You have cold sweats. Summary  Allergic rhinitis is a reaction to allergens in the air.  This condition may be caused by allergens. These include pollen, dust mites, pet dander, and mold.  Symptoms include a runny, itchy nose, sneezing, or tearing eyes. You may also have trouble sleeping or feel sleepy during the day.  Treatment includes taking medicines and avoiding allergens. You may also get shots or take stronger medicines.  Get help if you have a fever or a cough that does not stop. Get help right away if you are short of breath. This information is not intended to replace advice given to you by your health care provider. Make sure you discuss any questions you have with your  health care provider. Document Revised: 02/11/2019 Document Reviewed: 05/14/2018 Elsevier Patient Education  [place].

## 2020-01-15 NOTE — Progress Notes (Signed)
I personally saw and evaluated the patient, and participated in the management and treatment plan as documented in the resident's note.  Consuella Lose, MD 01/15/2020 7:59 PM

## 2020-01-15 NOTE — Progress Notes (Signed)
Virtual Visit via Video Note  I connected with Betty Logan 's patient  on 01/15/20 at  2:30 PM EST by a video enabled telemedicine application and verified that I am speaking with the correct person using two identifiers.   Location of patient/parent: [place]   I discussed the limitations of evaluation and management by telemedicine and the availability of in person appointments.  I discussed that the purpose of this telehealth visit is to provide medical care while limiting exposure to the novel coronavirus.  The patient expressed understanding and agreed to proceed.  Reason for visit:  Cough  History of Present Illness:  Betty Logan is a 19 yo F with history of MDD, PTSD, ODD, latent TB, and asthma presenting due to 3 weeks of feeling ill.   She reports for the past 2-3 weeks has felt sick. She describes a non-productive cough during the day and the cough also waking her from her sleep nightly for the past few weeks. She is unsure if she has a history of asthma but describes being prescribed an inhaler. She reports she lost one inhaler and has another but is unsure which it is. She reports using the inhaler without the spacer yesterday and today with some improvement. She denies fever, night sweats, SOB, increased WOB, chest tightness, or wheezing. She does not have eczema. She reports not SOB or wheezing with exertion but is not regularly physically active.   She also describes watery eyes, sneezing, and sore throat beginning yesterday. She reports she has had these symptoms previously in the spring. She used Flonase once yesterday without improvement. She has not taken any allergy medication by mouth.    Observations/Objective:  Well-appearing female, comfortable WOB. No audible wheezing or stridor.   Assessment and Plan:  Betty Logan is a 19 yo F with a history of asthma and allergies presenting due to persistent severe asthma. She describes daily cough and nightly awakenings with cough for the  past 2-3 weeks. She has no associated fever, night sweats, or increased WOB so I do not suspect re-activation of her latent TB or any other respiratory infection. She no longer has one of her inhalers or a spacer and describes using an inhaler (unsure if albuterol or Flovent) for the past few days without a spacer. Will stop Flovent and albuterol and prescribe SMART instead with Symbicort. Will start with budesonide 80 mcg/ formoterol 4.5 mcg 2 puffs BID with 1 puff PRN. Plan to follow up in 2 weeks to re-assess symptoms with room to increase dose of inhaler if symptoms not improved. She also describes acute onset of allergic symptoms since yesterday. Recommended started Zyrtec and Flonase daily. Provided asthma action plan in AVS.   Follow Up Instructions:  2 weeks to re-assess symptoms after starting Symbicort with spacer, and daily Zyrtec and Flonase.    I discussed the assessment and treatment plan with the patient and/or parent/guardian. They were provided an opportunity to ask questions and all were answered. They agreed with the plan and demonstrated an understanding of the instructions.   They were advised to call back or seek an in-person evaluation in the emergency room if the symptoms worsen or if the condition fails to improve as anticipated.  I spent 17 minutes on this telehealth visit inclusive of face-to-face video and care coordination time I was located at [facility] during this encounter.  Clair Gulling, MD

## 2020-01-29 ENCOUNTER — Encounter: Payer: Self-pay | Admitting: Pediatrics

## 2020-01-29 ENCOUNTER — Telehealth (INDEPENDENT_AMBULATORY_CARE_PROVIDER_SITE_OTHER): Payer: Medicaid Other | Admitting: Pediatrics

## 2020-01-29 DIAGNOSIS — G4700 Insomnia, unspecified: Secondary | ICD-10-CM | POA: Diagnosis not present

## 2020-01-29 MED ORDER — TRAZODONE HCL 50 MG PO TABS: 50.0000 mg | ORAL_TABLET | Freq: Every evening | ORAL | 0 refills | Status: DC | PRN

## 2020-01-29 NOTE — Progress Notes (Signed)
Virtual Visit via Video Note  I connected with Betty Logan on 01/29/20 at  3:30 PM EDT by a video enabled telemedicine application and verified that I am speaking with the correct person using two identifiers.   Location of patient/parent: patient home   I discussed the limitations of evaluation and management by telemedicine and the availability of in person appointments.  I discussed that the purpose of this telehealth visit is to provide medical care while limiting exposure to the novel coronavirus.  The patient expressed understanding and agreed to proceed.  Reason for visit:  F/u medication, difficulty sleeping  History of Present Illness:  19yo F with MDD, PTSD, ODD, latent TB, and asthma calling about poor sleep. Per Betty Logan, she has been off ALL medications (except symbicort) now for quite some time. Depakote she stopped (unsure why). Stop prazosin because it made her nightmares worse. No longer on Nuvaring and doesn't want any contraception. Calling today because she is unable to sleep. Tried child's melatonin and it seemed to help a bit but was wondering if an adult dose would be better. Difficulty initiating and maintaining sleep. Has tried good hygiene with keeping room cool, lights out, no electronics.    Observations/Objective: well appearing, outside of house, appears to be thin, mood consistent with affect ("fine")  Assessment and Plan: 18yo with complicated psychiatric history (now off meds) calling to discuss insomnia. Recommended trial of trazodone with close follow-up. OK to trial melatonin as well. Discussed with Betty Logan that the best option would be for her to restart psych meds as well as see a psychiatrist but that is not feasible for her at this current time. All questions answered.   Follow Up Instructions: 2 weeks    I discussed the assessment and treatment plan with the patient and/or parent/guardian. They were provided an opportunity to ask questions and all  were answered. They agreed with the plan and demonstrated an understanding of the instructions.   They were advised to call back or seek an in-person evaluation in the emergency room if the symptoms worsen or if the condition fails to improve as anticipated.  I spent 10 minutes on this telehealth visit inclusive of face-to-face video and care coordination time I was located at [facility] during this encounter.  Lady Deutscher, MD

## 2020-01-30 ENCOUNTER — Other Ambulatory Visit: Payer: Self-pay | Admitting: Pediatrics

## 2020-01-30 DIAGNOSIS — F331 Major depressive disorder, recurrent, moderate: Secondary | ICD-10-CM

## 2020-01-30 NOTE — Telephone Encounter (Signed)
Routing to correct pool, blue RX.

## 2020-02-17 ENCOUNTER — Telehealth: Payer: Self-pay

## 2020-02-17 NOTE — Telephone Encounter (Signed)
Pre-screening for onsite visit  1. Who is bringing the patient to the visit? Self  Informed only one adult can bring patient to the visit to limit possible exposure to COVID19 and facemasks must be worn while in the building by the patient (ages 2 and older) and adult.  2. Has the person bringing the patient or the patient been around anyone with suspected or confirmed COVID-19 in the last 14 days? No   3. Has the person bringing the patient or the patient been around anyone who has been tested for COVID-19 in the last 14 days? No  4. Has the person bringing the patient or the patient had any of these symptoms in the last 14 days? No   Fever (temp 100 F or higher) Breathing problems Cough Sore throat Body aches Chills Vomiting Diarrhea Loss of taste or smell   If all answers are negative, advise patient to call our office prior to your appointment if you or the patient develop any of the symptoms listed above.   If any answers are yes, cancel in-office visit and schedule the patient for a same day telehealth visit with a provider to discuss the next steps.  

## 2020-02-18 ENCOUNTER — Ambulatory Visit (INDEPENDENT_AMBULATORY_CARE_PROVIDER_SITE_OTHER): Payer: Medicaid Other | Admitting: Pediatrics

## 2020-02-18 ENCOUNTER — Encounter: Payer: Self-pay | Admitting: Pediatrics

## 2020-02-18 ENCOUNTER — Other Ambulatory Visit: Payer: Self-pay

## 2020-02-18 VITALS — Wt 124.4 lb

## 2020-02-18 DIAGNOSIS — J4550 Severe persistent asthma, uncomplicated: Secondary | ICD-10-CM | POA: Diagnosis not present

## 2020-02-18 DIAGNOSIS — J302 Other seasonal allergic rhinitis: Secondary | ICD-10-CM

## 2020-02-18 DIAGNOSIS — G4700 Insomnia, unspecified: Secondary | ICD-10-CM

## 2020-02-18 MED ORDER — TRAZODONE HCL 50 MG PO TABS: 50.0000 mg | ORAL_TABLET | Freq: Every evening | ORAL | 3 refills | Status: DC | PRN

## 2020-02-18 MED ORDER — CETIRIZINE HCL 10 MG PO TABS: 10.0000 mg | ORAL_TABLET | Freq: Every day | ORAL | 2 refills | Status: DC

## 2020-02-18 MED ORDER — BUDESONIDE-FORMOTEROL FUMARATE 80-4.5 MCG/ACT IN AERO: 2.0000 | INHALATION_SPRAY | Freq: Two times a day (BID) | RESPIRATORY_TRACT | 12 refills | Status: DC

## 2020-02-19 DIAGNOSIS — G4700 Insomnia, unspecified: Secondary | ICD-10-CM

## 2020-02-19 HISTORY — DX: Insomnia, unspecified: G47.00

## 2020-02-19 NOTE — Progress Notes (Signed)
PCP: Lady Deutscher, MD   Chief Complaint  Patient presents with  . Follow-up      Subjective:  HPI:  Betty Logan is a 19 y.o. female here for follow-up.  Sleeping much better using trazodone. Takes nightly. No side effects. Remains off medication for mental health disorders. Feels that with regulated sleep, she is fine. She did try to get a therapist but continues to struggle with consistency and specifically with her ride there.  Asthma well controlled. On Flovent and uses albuterol PRN. Rarely needs albuterol. Needs refill of Flovent and allergy meds.   Weight stable. Feels has normal appetite.  Still living with auntie. Helps with her kids. Feels welcome there. Has had no "men" problems recently.   REVIEW OF SYSTEMS:  CV: No chest pain/tenderness PULM: no difficulty breathing or increased work of breathing  GI: no vomiting, diarrhea, constipation SKIN: no blisters, rash, itchy skin, no bruising    Meds: Current Outpatient Medications  Medication Sig Dispense Refill  . budesonide-formoterol (SYMBICORT) 80-4.5 MCG/ACT inhaler Inhale 2 puffs into the lungs in the morning and at bedtime. 1 Inhaler 12  . cetirizine (ZYRTEC) 10 MG tablet Take 1 tablet (10 mg total) by mouth daily. 90 tablet 2  . etonogestrel-ethinyl estradiol (NUVARING) 0.12-0.015 MG/24HR vaginal ring INSERT VAGINALLY AND LEAVE IN PLACE FOR 3 CONSECUTIVE WEEKS, THEN REMOVE FOR 1 WEEK.    . fluticasone (FLONASE) 50 MCG/ACT nasal spray Place 1 spray into both nostrils daily. 1 g 0  . Spacer/Aero-Holding Chambers DEVI Use spacer with inhaler (Patient not taking: Reported on 02/18/2020) 1 Device 0  . traZODone (DESYREL) 50 MG tablet Take 1 tablet (50 mg total) by mouth at bedtime as needed for sleep. 90 tablet 3   No current facility-administered medications for this visit.    ALLERGIES:  Allergies  Allergen Reactions  . Cherry Swelling    PMH:  Past Medical History:  Diagnosis Date  . Deliberate  medication overdose (HCC)   . Depression   . Hypertension     PSH:  Past Surgical History:  Procedure Laterality Date  . EYE SURGERY      Social history:  See above. Lives with auntie.   Family history: Family History  Problem Relation Age of Onset  . Hypertension Mother      Objective:   Physical Examination:  Temp:   Pulse:   BP:   (Blood pressure percentiles are not available for patients who are 18 years or older.)  Wt: 124 lb 6.4 oz (56.4 kg)  Ht:    BMI: Body mass index is 20.08 kg/m. (21 %ile (Z= -0.82) based on CDC (Girls, 2-20 Years) BMI-for-age based on BMI available as of 09/29/2019 from contact on 09/29/2019.) GENERAL: Well appearing, no distress HEENT: NCAT, clear sclerae, TMs normal bilaterally, no nasal discharge, no tonsillary erythema or exudate, MMM NECK: Supple, no cervical LAD LUNGS: EWOB, CTAB, no wheeze, no crackles CARDIO: RRR, normal S1S2 no murmur, well perfused    Assessment/Plan:   Betty Logan is a 19 y.o. old female here for insomnia follow-up. Doing much better on Trazodone with significant improvement in mood. Recommended continuation of this with continued work on sleep hygiene.  Refill provided of flovent. Recommended she continue allergy med during this time to prevent exacerbation. Recommended follow-up in about 3 months but if she worsens at any point, she knows to make an apt (even video if unable to come in person).  Follow up: Return in about 3 months (around 05/19/2020) for  follow-up with Alma Friendly.   Alma Friendly, MD  [facility]

## 2020-02-21 ENCOUNTER — Encounter (HOSPITAL_COMMUNITY): Payer: Self-pay

## 2020-02-21 ENCOUNTER — Ambulatory Visit (HOSPITAL_COMMUNITY)
Admission: EM | Admit: 2020-02-21 | Discharge: 2020-02-21 | Disposition: A | Discharge status: Home / Self Care | Payer: Medicare Other

## 2020-02-21 ENCOUNTER — Other Ambulatory Visit: Payer: Self-pay

## 2020-02-21 DIAGNOSIS — G44319 Acute post-traumatic headache, not intractable: Secondary | ICD-10-CM

## 2020-02-21 DIAGNOSIS — R0781 Pleurodynia: Secondary | ICD-10-CM

## 2020-02-21 NOTE — Discharge Instructions (Addendum)
Nothing concerning on exam today. You can do ice to the face for swelling and pain Same for the rib area Ibuprofen or Tylenol as needed If you start developing any severe headache, nausea, vomiting, dizziness, vision changes you need to come back in for recheck
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
## 2020-03-05 ENCOUNTER — Other Ambulatory Visit: Payer: Self-pay | Admitting: Pediatrics

## 2020-03-05 DIAGNOSIS — J4540 Moderate persistent asthma, uncomplicated: Secondary | ICD-10-CM

## 2020-03-05 NOTE — Telephone Encounter (Signed)
Routing to correct pool, blue Rx.  

## 2020-04-05 ENCOUNTER — Other Ambulatory Visit: Payer: Self-pay

## 2020-04-05 ENCOUNTER — Ambulatory Visit (HOSPITAL_COMMUNITY)
Admission: EM | Admit: 2020-04-05 | Discharge: 2020-04-05 | Disposition: A | Discharge status: Home / Self Care | Payer: Medicaid Other

## 2020-05-17 ENCOUNTER — Ambulatory Visit: Payer: Medicaid Other | Admitting: Pediatrics

## 2020-05-20 ENCOUNTER — Other Ambulatory Visit: Payer: Self-pay

## 2020-05-20 ENCOUNTER — Encounter: Payer: Self-pay | Admitting: Pediatrics

## 2020-05-20 ENCOUNTER — Ambulatory Visit (INDEPENDENT_AMBULATORY_CARE_PROVIDER_SITE_OTHER): Payer: Medicaid Other

## 2020-05-20 ENCOUNTER — Ambulatory Visit (INDEPENDENT_AMBULATORY_CARE_PROVIDER_SITE_OTHER): Payer: Medicaid Other | Admitting: Pediatrics

## 2020-05-20 ENCOUNTER — Other Ambulatory Visit (HOSPITAL_COMMUNITY)
Admission: RE | Admit: 2020-05-20 | Discharge: 2020-05-20 | Disposition: A | Discharge status: Home / Self Care | Payer: Medicaid Other | Source: Ambulatory Visit | Attending: Pediatrics | Admitting: Pediatrics

## 2020-05-20 VITALS — Wt 121.8 lb

## 2020-05-20 DIAGNOSIS — Z113 Encounter for screening for infections with a predominantly sexual mode of transmission: Secondary | ICD-10-CM | POA: Diagnosis not present

## 2020-05-20 DIAGNOSIS — Z23 Encounter for immunization: Secondary | ICD-10-CM

## 2020-05-20 DIAGNOSIS — Z3169 Encounter for other general counseling and advice on procreation: Secondary | ICD-10-CM | POA: Diagnosis not present

## 2020-05-20 DIAGNOSIS — J4540 Moderate persistent asthma, uncomplicated: Secondary | ICD-10-CM

## 2020-05-20 NOTE — Progress Notes (Signed)
   Covid-19 Vaccination Clinic  Name:  VIKTORYA ARGUIJO    MRN: [id number] DOB: 12/23/2000  05/20/2020  Ms. Culbreth was observed post Covid-19 immunization for 15 minutes without incident. She was provided with Vaccine Information Sheet and instruction to access the V-Safe system.   Ms. Rotolo was instructed to call 911 with any severe reactions post vaccine: Marland Kitchen Difficulty breathing  . Swelling of face and throat  . A fast heartbeat  . A bad rash all over body  . Dizziness and weakness   Immunizations Administered    Name Date Dose VIS Date Route   Pfizer COVID-19 Vaccine 05/20/2020  3:54 PM 0.3 mL 12/31/2018 Intramuscular   Manufacturer: [employer], [employer]   Lot: [id number]   NDC: [address]-2
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
## 2020-05-21 LAB — HEPATITIS B SURFACE ANTIGEN: Hepatitis B Surface Ag: NONREACTIVE

## 2020-05-21 LAB — HIV ANTIBODY (ROUTINE TESTING W REFLEX): HIV 1&2 Ab, 4th Generation: NONREACTIVE

## 2020-05-21 LAB — HEPATITIS C ANTIBODY
Hepatitis C Ab: NONREACTIVE
SIGNAL TO CUT-OFF: 0.02 (ref <1.00)

## 2020-05-21 LAB — RPR: RPR Ser Ql: NONREACTIVE

## 2020-05-21 MED ORDER — ALBUTEROL SULFATE HFA 108 (90 BASE) MCG/ACT IN AERS: 2.0000 | INHALATION_SPRAY | RESPIRATORY_TRACT | 0 refills | Status: DC | PRN

## 2020-05-25 LAB — URINE CYTOLOGY ANCILLARY ONLY
Chlamydia: NEGATIVE
Comment: NEGATIVE
Comment: NORMAL
Neisseria Gonorrhea: NEGATIVE

## 2020-05-27 ENCOUNTER — Ambulatory Visit (INDEPENDENT_AMBULATORY_CARE_PROVIDER_SITE_OTHER): Payer: Medicaid Other | Admitting: Pediatrics

## 2020-05-27 ENCOUNTER — Encounter: Payer: Self-pay | Admitting: Pediatrics

## 2020-05-27 ENCOUNTER — Other Ambulatory Visit: Payer: Self-pay

## 2020-05-27 VITALS — Temp 98.1°F | Wt 117.8 lb

## 2020-05-27 DIAGNOSIS — J302 Other seasonal allergic rhinitis: Secondary | ICD-10-CM | POA: Diagnosis not present

## 2020-05-27 DIAGNOSIS — T50Z95A Adverse effect of other vaccines and biological substances, initial encounter: Secondary | ICD-10-CM

## 2020-05-27 MED ORDER — FLUTICASONE PROPIONATE 50 MCG/ACT NA SUSP: 1.0000 | Freq: Every day | NASAL | 3 refills | Status: DC

## 2020-05-27 MED ORDER — ONDANSETRON HCL 4 MG PO TABS: 4.0000 mg | ORAL_TABLET | Freq: Three times a day (TID) | ORAL | 0 refills | Status: DC | PRN

## 2020-05-27 MED ORDER — NAPROXEN 500 MG PO TABS
500.0000 mg | ORAL_TABLET | Freq: Two times a day (BID) | ORAL | 0 refills | Status: AC
Start: 2020-05-27 — End: 2020-05-30

## 2020-05-29 NOTE — Progress Notes (Signed)
PCP: Lady Deutscher, MD   Chief Complaint  Patient presents with  . Follow-up  . Emesis    since getting the covid vaccine- has also been getting migraines and has been dizzy and felt warm and coughing   . Medication Refill    flonase      Subjective:  HPI:  Betty Logan is a 19 y.o. female here for f/u of the COVID vaccine. Received 4 days ago and now feels poor. States that since, she has had a bad headache (describes frontal); has not tried any medicine. Feels that it makes her dizzy and warm. No changes in her vision. Trying to drink fluids but feels nauseous. Did throw up the first day and day after from the vaccine but now just feeling nauseous.  Does not describe as a thunderclap headache or worse headache of her life. Does not have any changes in vision. Not worsening but staying the same.  Received the pfizer (not j&J).  REVIEW OF SYSTEMS:  GENERAL: not toxic appearing ENT: no eye discharge, no ear pain, no difficulty swallowing CV: No chest pain/tenderness PULM: no difficulty breathing or increased work of breathing  SKIN: no blisters, rash, itchy skin, no bruising EXTREMITIES: No edema    Meds: Current Outpatient Medications  Medication Sig Dispense Refill  . albuterol (VENTOLIN HFA) 108 (90 Base) MCG/ACT inhaler Inhale 2 puffs into the lungs every 4 (four) hours as needed for wheezing or shortness of breath. 18 g 0  . budesonide-formoterol (SYMBICORT) 80-4.5 MCG/ACT inhaler Inhale 2 puffs into the lungs in the morning and at bedtime. 1 Inhaler 12  . cetirizine (ZYRTEC) 10 MG tablet Take 1 tablet (10 mg total) by mouth daily. 90 tablet 2  . Spacer/Aero-Holding Chambers DEVI Use spacer with inhaler 1 Device 0  . etonogestrel-ethinyl estradiol (NUVARING) 0.12-0.015 MG/24HR vaginal ring INSERT VAGINALLY AND LEAVE IN PLACE FOR 3 CONSECUTIVE WEEKS, THEN REMOVE FOR 1 WEEK. (Patient not taking: Reported on 05/27/2020)    . fluticasone (FLONASE) 50 MCG/ACT nasal spray  Place 1 spray into both nostrils daily. 1 g 3  . naproxen (NAPROSYN) 500 MG tablet Take 1 tablet (500 mg total) by mouth 2 (two) times daily with a meal for 3 days. 20 tablet 0  . ondansetron (ZOFRAN) 4 MG tablet Take 1 tablet (4 mg total) by mouth every 8 (eight) hours as needed for nausea or vomiting. 8 tablet 0  . traZODone (DESYREL) 50 MG tablet Take 1 tablet (50 mg total) by mouth at bedtime as needed for sleep. (Patient not taking: Reported on 05/27/2020) 90 tablet 3   No current facility-administered medications for this visit.    ALLERGIES:  Allergies  Allergen Reactions  . Cherry Swelling    PMH:  Past Medical History:  Diagnosis Date  . Deliberate medication overdose (HCC)   . Depression   . Hypertension     PSH:  Past Surgical History:  Procedure Laterality Date  . EYE SURGERY      Social history:  Social History   Social History Narrative  . Not on file    Family history: Family History  Problem Relation Age of Onset  . Hypertension Mother      Objective:   Physical Examination:  Temp: 98.1 F (36.7 C) (Temporal) Pulse:   BP:   (Blood pressure percentiles are not available for patients who are 18 years or older.)  Wt: 117 lb 12.8 oz (53.4 kg)  Ht:    BMI: Body mass index is  19.01 kg/m. (23 %ile (Z= -0.73) based on CDC (Girls, 2-20 Years) BMI-for-age data using weight from 05/20/2020 and height from 09/29/2019 from contact on 05/20/2020.) GENERAL: Well appearing, no distress HEENT: NCAT, clear sclerae (L strabismus--at baseline), TMs normal bilaterally, no nasal discharge, no tonsillary erythema or exudate, MMM NECK: Supple, no cervical LAD LUNGS: EWOB, CTAB, no wheeze, no crackles CARDIO: RRR, normal S1S2 no murmur, well perfused EXTREMITIES: Warm and well perfused, no deformity NEURO: Awake, alert, interactive, normal strength, tone, sensation, and gait. Normal CN II-XII. Normal strength of UE and LE. PERRL    Assessment/Plan:   Betty Logan is a  19 y.o. old female here for likely post-COVID vaccine reaction. Discussed with Betty Logan that her symptoms appear to be within the normal of some responses to the vaccine. Reassurance provided. Recommended Naproxen BID x 3 days as well as zofran PRN. Return precautions given (specifically return if no improvement by Monday or go to the ED if worsening). Received pfizer and no concern for blood clot/CVA given normal neuro exam.  Follow up: Return if symptoms worsen or fail to improve.   Lady Deutscher, MD  [facility]

## 2020-06-11 ENCOUNTER — Ambulatory Visit: Payer: Medicaid Other

## 2020-08-07 ENCOUNTER — Other Ambulatory Visit: Payer: Self-pay

## 2020-08-07 DIAGNOSIS — J4540 Moderate persistent asthma, uncomplicated: Secondary | ICD-10-CM

## 2020-08-09 ENCOUNTER — Other Ambulatory Visit: Payer: Self-pay

## 2020-08-09 ENCOUNTER — Ambulatory Visit: Payer: Medicaid Other | Admitting: Pediatrics

## 2020-08-09 NOTE — Progress Notes (Unsigned)
Patient was a no-show 

## 2020-08-09 NOTE — Telephone Encounter (Signed)
Patient has an appointment scheduled for this afternoon. Shortness of breath when outside. No wheezing.

## 2020-08-22 ENCOUNTER — Encounter (HOSPITAL_COMMUNITY): Payer: Self-pay | Admitting: Emergency Medicine

## 2020-08-22 ENCOUNTER — Encounter (HOSPITAL_COMMUNITY): Payer: Self-pay | Admitting: *Deleted

## 2020-08-22 ENCOUNTER — Emergency Department (HOSPITAL_COMMUNITY)
Admission: EM | Admit: 2020-08-22 | Discharge: 2020-08-22 | Disposition: A | Discharge status: Home / Self Care | Payer: Medicare Other | Attending: Emergency Medicine | Admitting: Emergency Medicine

## 2020-08-22 ENCOUNTER — Other Ambulatory Visit: Payer: Self-pay

## 2020-08-22 ENCOUNTER — Ambulatory Visit (HOSPITAL_COMMUNITY)
Admission: EM | Admit: 2020-08-22 | Discharge: 2020-08-22 | Disposition: A | Discharge status: Home / Self Care | Payer: Medicare Other

## 2020-08-22 DIAGNOSIS — I10 Essential (primary) hypertension: Secondary | ICD-10-CM | POA: Insufficient documentation

## 2020-08-22 DIAGNOSIS — R079 Chest pain, unspecified: Secondary | ICD-10-CM | POA: Diagnosis present

## 2020-08-22 DIAGNOSIS — Z87891 Personal history of nicotine dependence: Secondary | ICD-10-CM | POA: Insufficient documentation

## 2020-08-22 DIAGNOSIS — R413 Other amnesia: Secondary | ICD-10-CM

## 2020-08-22 DIAGNOSIS — T50901A Poisoning by unspecified drugs, medicaments and biological substances, accidental (unintentional), initial encounter: Secondary | ICD-10-CM

## 2020-08-22 DIAGNOSIS — Z7951 Long term (current) use of inhaled steroids: Secondary | ICD-10-CM | POA: Diagnosis not present

## 2020-08-22 DIAGNOSIS — Z79899 Other long term (current) drug therapy: Secondary | ICD-10-CM | POA: Diagnosis not present

## 2020-08-22 DIAGNOSIS — F1290 Cannabis use, unspecified, uncomplicated: Secondary | ICD-10-CM
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
## 2020-08-22 NOTE — Discharge Instructions (Signed)
Go to the ER for further evaluation and treatment. 

## 2020-08-22 NOTE — ED Notes (Signed)
Patient is being discharged from the Urgent Care and sent to the Emergency Department via private vehicle . Per stephanie, np, patient is in need of higher level of care due to patient complaint of memory loss and testing requesting. Patient is aware and verbalizes understanding of plan of care.  Vitals:   08/22/20 1121  BP: 115/77  Pulse: 87  Resp: 16  Temp: 98.2 F (36.8 C)  SpO2: 100%

## 2020-08-22 NOTE — ED Triage Notes (Signed)
Pt states she was sent from [facility] for lab work because she has memory loss.  Pt states she last remembers eating edibles and smoking 2 blunts on Friday.  States she woke up today with pain to center of chest.  Denies SOB.

## 2020-08-22 NOTE — Discharge Instructions (Signed)
I strongly recommend that you refrain from eating any marijuana-containing substances as it is not legal enough, and is not regulated.  Please go home and drink plenty of water continue to monitor your symptoms may always return to the emergency department for any new or concerning limits however there is no additional work-up for the symptoms that you have described today.

## 2020-08-22 NOTE — ED Provider Notes (Signed)
[facility] EMERGENCY DEPARTMENT Provider Note   CSN: [id number] Arrival date & time: 08/22/20  1217     History Chief Complaint  Patient presents with  . Chest Pain  . memory loss    Betty Logan is a 19 y.o. female.  HPI  Patient is 19 year old female with past medical history significant for depression hypertension she is presented today after she ate a marijuana gummy 2 days ago. She states that she intentionally ate a marijuana gummy that she bought at the store. She states she went to the urgent care today because she has no memory of the last 2 days and was told by her mother that she slept almost all day yesterday. She also smoked 2 blunts of her friend. She denies any other symptoms and states she does not recall much after that. She states she woke up this morning confused about what day it was. She denies any other symptoms such as headache, fevers, chills, nausea, vomiting or abdominal pain. Denies any chest pain although she appears to have told triage nurse earlier that she did have chest pain she denies this for me. She denies any intentional overdose and denies any other drug use. Said that she was seen at urgent care earlier today and was sent for drug testing however patient declines additional testing and is comfortable discharge home at this time. I discussed with patient's aunt who is understanding of situation and had no further questions or requests.     Past Medical History:  Diagnosis Date  . Deliberate medication overdose (HCC)   . Depression   . Hypertension     Patient Active Problem List   Diagnosis Date Noted  . Insomnia 02/19/2020  . 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 Nightmares 10/08/2014  . Oppositional defiant disorder 09/17/2014  . Post traumatic stress disorder (PTSD) 09/17/2014  . MDD (major depressive disorder), recurrent episode, moderate (HCC) 09/16/2014    Past Surgical History:  Procedure Laterality Date  . EYE  SURGERY       OB History    Gravida  0   Para  0   Term  0   Preterm  0   AB  0   Living  0     SAB  0   TAB  0   Ectopic  0   Multiple  0   Live Births  0           Family History  Problem Relation Age of Onset  . Hypertension Mother     Social History   Tobacco Use  . Smoking status: Former Games developer  . Smokeless tobacco: Never Used  Substance Use Topics  . Alcohol use: Yes  . Drug use: Yes    Types: Marijuana    Home Medications Prior to Admission medications   Medication Sig Start Date End Date Taking? Authorizing Provider  albuterol (VENTOLIN HFA) 108 (90 Base) MCG/ACT inhaler Inhale 2 puffs into the lungs every 4 (four) hours as needed for wheezing or shortness of breath. 05/21/20   Ettefagh, Aron Baba, MD  budesonide-formoterol [facility]) 80-4.5 MCG/ACT inhaler Inhale 2 puffs into the lungs in the morning and at bedtime. 02/18/20   Lady Deutscher, MD  cetirizine (ZYRTEC) 10 MG tablet Take 1 tablet (10 mg total) by mouth daily. 02/18/20   Lady Deutscher, MD  etonogestrel-ethinyl estradiol (NUVARING) 0.12-0.015 MG/24HR vaginal ring INSERT VAGINALLY AND LEAVE IN PLACE FOR 3 CONSECUTIVE WEEKS, THEN REMOVE FOR 1 WEEK. Patient not taking:  Reported on 05/27/2020 07/01/19   [provider]  fluticasone (FLONASE) 50 MCG/ACT nasal spray Place 1 spray into both nostrils daily. 05/27/20 06/26/20  Lady Deutscher, MD  ondansetron (ZOFRAN) 4 MG tablet Take 1 tablet (4 mg total) by mouth every 8 (eight) hours as needed for nausea or vomiting. 05/27/20   Lady Deutscher, MD  Spacer/Aero-Holding Deretha Emory DEVI Use spacer with inhaler 01/15/20   Clair Gulling, MD  traZODone (DESYREL) 50 MG tablet Take 1 tablet (50 mg total) by mouth at bedtime as needed for sleep. Patient not taking: Reported on 05/27/2020 02/18/20   Lady Deutscher, MD    Allergies    Valentino Saxon  Review of Systems   Review of Systems  Constitutional: Negative for fever.  HENT: Negative for  congestion.   Respiratory: Negative for shortness of breath.   Cardiovascular: Negative for chest pain.  Gastrointestinal: Negative for abdominal distention.  Neurological: Negative for dizziness and headaches.   Physical Exam Updated Vital Signs BP 120/86 (BP Location: Right Arm)   Pulse 72   Temp 98.2 F (36.8 C) (Oral)   Resp 16   Ht 5\' 6"  (1.676 m)   Wt 54.4 kg   LMP 08/07/2020   SpO2 100%   BMI 19.37 kg/m   Physical Exam Vitals and nursing note reviewed.  Constitutional:      General: She is not in acute distress. HENT:     Head: Normocephalic and atraumatic.     Nose: Nose normal.     Mouth/Throat:     Mouth: Mucous membranes are moist.  Eyes:     General: No scleral icterus. Cardiovascular:     Rate and Rhythm: Normal rate and regular rhythm.     Pulses: Normal pulses.     Heart sounds: Normal heart sounds.  Pulmonary:     Effort: Pulmonary effort is normal. No respiratory distress.     Breath sounds: No wheezing.  Abdominal:     Palpations: Abdomen is soft.     Tenderness: There is no abdominal tenderness. There is no guarding or rebound.  Musculoskeletal:     Cervical back: Normal range of motion.     Right lower leg: No edema.     Left lower leg: No edema.  Skin:    General: Skin is warm and dry.     Capillary Refill: Capillary refill takes less than 2 seconds.  Neurological:     Mental Status: She is alert. Mental status is at baseline.     Comments: Alert and oriented to self, place, time and event.   Speech is fluent, clear without dysarthria or dysphasia.   Strength 5/5 in upper/lower extremities  Sensation intact in upper/lower extremities   Normal gait.  Negative Romberg. No pronator drift.  Normal finger-to-nose and feet tapping.  CN I not tested  CN II grossly intact visual fields bilaterally. Did not visualize posterior eye.   CN III, IV, VI PERRLA and EOMs intact bilaterally  CN V Intact sensation to sharp and light touch to the face   CN VII facial movements symmetric  CN VIII not tested  CN IX, X no uvula deviation, symmetric rise of soft palate  CN XI 5/5 SCM and trapezius strength bilaterally  CN XII Midline tongue protrusion, symmetric L/R movements   Psychiatric:        Mood and Affect: Mood normal.        Behavior: Behavior normal.     ED Results / Procedures / Treatments   Labs (  all labs ordered are listed, but only abnormal results are displayed) Labs Reviewed - No data to display  EKG None  Radiology No results found.  Procedures Procedures (including critical care time)  Medications Ordered in ED Medications - No data to display  ED Course  I have reviewed the triage vital signs and the nursing notes.  Pertinent labs & imaging results that were available during my care of the patient were reviewed by me and considered in my medical decision making (see chart for details).    MDM Rules/Calculators/A&P                          Patient is 19 year old female with no pertinent past medical history presented today for concerns over memory loss after she smoked 2 blunts and 8 and on certain number of marijuana levels in the form of Gummies. This happened on Friday. She states that she woke up today with no memory of what occurred between Friday and today. Initially complained of chest pain at triage however denies any chest pain whatsoever to me.  Vital signs are within normal limits. I discussed with her aunt and she is understanding of the limitations of evaluation for marijuana induced memory loss in the emergency department setting. I recommend she not smoke anymore weed or eating or edibles. I discussed the case with my physician Dr. Clarice Pole. We will discharge at this time. Aunt is agreeable to plan as well as patient. I did offer rapid urine drug screen the patient agrees she would like to have any additional testing to see if she has been slipped any opioids or other medications however she  declined.  Chest x-ray is normal sinus rhythm no significant axis deviation or ST-T wave abnormalities.   Final Clinical Impression(s) / ED Diagnoses Final diagnoses:  Uses marijuana    Rx / DC Orders ED Discharge Orders    None       Gailen Shelter, [place] 08/22/20 1622    Arby Barrette, MD 08/23/20 [phone]
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
## 2020-08-22 NOTE — ED Notes (Signed)
Patient verbalizes understanding of discharge instructions. Opportunity for questioning and answers were provided. Armband removed by staff, pt discharged from ED ambulatory via cab. Pt states she is going home to [employer].
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
## 2020-10-14 ENCOUNTER — Encounter (HOSPITAL_COMMUNITY): Payer: Self-pay

## 2020-10-14 ENCOUNTER — Other Ambulatory Visit: Payer: Self-pay

## 2020-10-14 ENCOUNTER — Ambulatory Visit (HOSPITAL_COMMUNITY)
Admission: EM | Admit: 2020-10-14 | Discharge: 2020-10-14 | Disposition: A | Discharge status: Home / Self Care | Payer: Medicare Other | Attending: Family Medicine | Admitting: Family Medicine

## 2020-10-14 DIAGNOSIS — T7840XA Allergy, unspecified, initial encounter: Secondary | ICD-10-CM | POA: Diagnosis not present

## 2020-10-14 DIAGNOSIS — T7804XA Anaphylactic reaction due to fruits and vegetables, initial encounter: Secondary | ICD-10-CM | POA: Diagnosis not present

## 2020-10-14 MED ORDER — DIPHENHYDRAMINE HCL 25 MG PO CAPS
ORAL_CAPSULE | ORAL | Status: AC
  Filled 2020-10-14: qty 2

## 2020-10-14 MED ORDER — ALBUTEROL SULFATE HFA 108 (90 BASE) MCG/ACT IN AERS
2.0000 | INHALATION_SPRAY | Freq: Once | RESPIRATORY_TRACT | Status: AC
  Administered 2020-10-14: 2 via RESPIRATORY_TRACT

## 2020-10-14 MED ORDER — DIPHENHYDRAMINE HCL 25 MG PO CAPS
50.0000 mg | ORAL_CAPSULE | Freq: Once | ORAL | Status: AC
  Administered 2020-10-14: 50 mg via ORAL

## 2020-10-14 MED ORDER — ALBUTEROL SULFATE HFA 108 (90 BASE) MCG/ACT IN AERS
INHALATION_SPRAY | RESPIRATORY_TRACT | Status: AC
  Filled 2020-10-14: qty 6.7

## 2020-10-14 NOTE — ED Provider Notes (Signed)
[facility]    CSN: [id number] Arrival date & time: 10/14/20  1312      History   Chief Complaint Chief Complaint  Patient presents with  . Allergic Reaction    HPI Betty Logan is a 19 y.o. female presenting for sensation of throat swelling up. States that she ate some pineapple 1 day ago. A few hours later, her throat felt that it was swelling, and she had difficulty swallowing. States this has not improved in the last 24 hours, and she still has trouble swallowing. She denies shortness of breath, dyspnea on exertion, chest pain, facial swelling. She has not had an issue with pineapple in the past.  HPI  Past Medical History:  Diagnosis Date  . Deliberate medication overdose (HCC)   . Depression   . Hypertension     Patient Active Problem List   Diagnosis Date Noted  . Insomnia 02/19/2020  . 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 Nightmares 10/08/2014  . Oppositional defiant disorder 09/17/2014  . Post traumatic stress disorder (PTSD) 09/17/2014  . MDD (major depressive disorder), recurrent episode, moderate (HCC) 09/16/2014    Past Surgical History:  Procedure Laterality Date  . EYE SURGERY      OB History    Gravida  0   Para  0   Term  0   Preterm  0   AB  0   Living  0     SAB  0   IAB  0   Ectopic  0   Multiple  0   Live Births  0            Home Medications    Prior to Admission medications   Medication Sig Start Date End Date Taking? Authorizing Provider  albuterol (VENTOLIN HFA) 108 (90 Base) MCG/ACT inhaler Inhale 2 puffs into the lungs every 4 (four) hours as needed for wheezing or shortness of breath. Patient not taking: No sig reported 05/21/20 10/14/20  Ettefagh, Aron Baba, MD  budesonide-formoterol [facility]) 80-4.5 MCG/ACT inhaler Inhale 2 puffs into the lungs in the morning and at bedtime. Patient not taking: Reported on 10/14/2020 02/18/20 10/14/20  Lady Deutscher, MD  cetirizine (ZYRTEC) 10 MG tablet Take 1  tablet (10 mg total) by mouth daily. Patient not taking: Reported on 10/14/2020 02/18/20 10/14/20  Lady Deutscher, MD  fluticasone [facility]) 50 MCG/ACT nasal spray Place 1 spray into both nostrils daily. Patient not taking: Reported on 10/14/2020 05/27/20 10/14/20  Lady Deutscher, MD  traZODone (DESYREL) 50 MG tablet Take 1 tablet (50 mg total) by mouth at bedtime as needed for sleep. Patient not taking: Reported on 05/27/2020 02/18/20 10/14/20  Lady Deutscher, MD    Family History Family History  Problem Relation Age of Onset  . Hypertension Mother     Social History Social History   Tobacco Use  . Smoking status: Former Games developer  . Smokeless tobacco: Never Used  Substance Use Topics  . Alcohol use: Yes  . Drug use: Yes    Types: Marijuana     Allergies   Cherry   Review of Systems Review of Systems  HENT: Positive for trouble swallowing. Negative for facial swelling and sore throat.   Respiratory: Negative for choking, chest tightness, shortness of breath and wheezing.   Cardiovascular: Negative for chest pain.  All other systems reviewed and are negative.    Physical Exam Triage Vital Signs ED Triage Vitals  Enc Vitals Group     BP 10/14/20 1322 133/80  Pulse Rate 10/14/20 1322 94     Resp 10/14/20 1322 16     Temp 10/14/20 1322 98.9 F (37.2 C)     Temp Source 10/14/20 1322 Oral     SpO2 --      Weight 10/14/20 1322 119 lb 12.8 oz (54.3 kg)     Height --      Head Circumference --      Peak Flow --      Pain Score 10/14/20 1323 8     Pain Loc --      Pain Edu? --      Excl. in GC? --    No data found.  Updated Vital Signs BP 133/80 (BP Location: Right Arm)   Pulse 94   Temp 98.9 F (37.2 C) (Oral)   Resp 16   Wt 119 lb 12.8 oz (54.3 kg)   LMP 10/07/2020   SpO2 99%   BMI 19.34 kg/m   Visual Acuity Right Eye Distance:   Left Eye Distance:   Bilateral Distance:    Right Eye Near:   Left Eye Near:    Bilateral Near:     Physical  Exam Vitals reviewed.  Constitutional:      Comments: Patient appears comfortable and is oxygenating well on room air.  HENT:     Head: Normocephalic and atraumatic.     Mouth/Throat:     Mouth: Mucous membranes are moist.     Tongue: Tongue does not deviate from midline.     Pharynx: Oropharynx is clear. Uvula midline. No pharyngeal swelling, posterior oropharyngeal erythema or uvula swelling.     Tonsils: No tonsillar exudate or tonsillar abscesses.     Comments: Lips, mouth, tongue, and pharynx appear normal and not swolen.  Eyes:     Comments: (L strabismus--at baseline),   Cardiovascular:     Rate and Rhythm: Normal rate and regular rhythm.     Heart sounds: Normal heart sounds.  Pulmonary:     Effort: Pulmonary effort is normal.     Breath sounds: Normal breath sounds and air entry.  Neurological:     Mental Status: She is alert.      UC Treatments / Results  Labs (all labs ordered are listed, but only abnormal results are displayed) Labs Reviewed - No data to display  EKG   Radiology No results found.  Procedures Procedures (including critical care time)  Medications Ordered in UC Medications  albuterol (VENTOLIN HFA) 108 (90 Base) MCG/ACT inhaler 2 puff (2 puffs Inhalation Given 10/14/20 1357)  diphenhydrAMINE (BENADRYL) capsule 50 mg (50 mg Oral Given 10/14/20 1356)    Initial Impression / Assessment and Plan / UC Course  I have reviewed the triage vital signs and the nursing notes.  Pertinent labs & imaging results that were available during my care of the patient were reviewed by me and considered in my medical decision making (see chart for details).     Patient appears comfortable and is oxygenating well on room air. Vital signs are stable. She states she is feeling improved after albuterol and benedryl administered at this visit. Discharged home on albuterol and benedryl prn. Patient is in agreement with this treatment plan.   Final Clinical  Impressions(s) / UC Diagnoses   Final diagnoses:  Allergic reaction, initial encounter     Discharge Instructions     Continue to use albuterol inhaler at home as needed. You can also use benedryl at home.    ED Prescriptions  None     PDMP not reviewed this encounter.   Rhys Martini, PA-C 10/14/20 1444

## 2020-10-14 NOTE — ED Triage Notes (Signed)
Pt states she had a allergic reaction to some pineapple. Pt states she had this yesterday afternoon. ( throat is swelling up ) .

## 2020-10-14 NOTE — ED Notes (Signed)
Spoke to patient in lobby .  Patient reports eating pineapple yesterday.  At the time remembers tongue itching, but did not think anything of this.  Today reports throat is painful and swollen.  Denies difficulty breathing.  No rash, no itching.  Patient thinks this is related to a reaction.

## 2020-10-14 NOTE — Discharge Instructions (Addendum)
Continue to use albuterol inhaler at home as needed. You can also use benedryl at home.

## 2020-10-24 ENCOUNTER — Ambulatory Visit (HOSPITAL_COMMUNITY)
Admission: EM | Admit: 2020-10-24 | Discharge: 2020-10-24 | Disposition: A | Discharge status: Home / Self Care | Payer: Medicare Other | Attending: Family Medicine | Admitting: Family Medicine

## 2020-10-24 ENCOUNTER — Encounter (HOSPITAL_COMMUNITY): Payer: Self-pay

## 2020-10-24 ENCOUNTER — Other Ambulatory Visit: Payer: Self-pay

## 2020-10-24 DIAGNOSIS — T7491XA Unspecified adult maltreatment, confirmed, initial encounter: Secondary | ICD-10-CM | POA: Diagnosis not present

## 2020-10-24 DIAGNOSIS — M791 Myalgia, unspecified site: Secondary | ICD-10-CM

## 2020-10-24 MED ORDER — CYCLOBENZAPRINE HCL 5 MG PO TABS: 5.0000 mg | ORAL_TABLET | Freq: Every evening | ORAL | 0 refills | Status: DC | PRN
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
## 2020-10-26 ENCOUNTER — Ambulatory Visit (HOSPITAL_COMMUNITY)
Admission: EM | Admit: 2020-10-26 | Discharge: 2020-10-26 | Disposition: A | Discharge status: Home / Self Care | Payer: Medicare Other | Attending: Student | Admitting: Student

## 2020-10-26 ENCOUNTER — Encounter (HOSPITAL_COMMUNITY): Payer: Self-pay

## 2020-10-26 DIAGNOSIS — Z113 Encounter for screening for infections with a predominantly sexual mode of transmission: Secondary | ICD-10-CM | POA: Insufficient documentation

## 2020-10-26 LAB — HIV ANTIBODY (ROUTINE TESTING W REFLEX): HIV Screen 4th Generation wRfx: NONREACTIVE

## 2020-10-26 NOTE — ED Triage Notes (Signed)
Pt presents for STD's testing. Denies dysuria, abdominal pain, vaginal discharge.

## 2020-10-26 NOTE — Discharge Instructions (Addendum)
We'll call you if the results of any of your labwork is positive.
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
## 2020-10-27 ENCOUNTER — Telehealth (HOSPITAL_COMMUNITY): Payer: Self-pay | Admitting: Emergency Medicine

## 2020-10-27 LAB — CERVICOVAGINAL ANCILLARY ONLY
Bacterial Vaginitis (gardnerella): POSITIVE — AB
Candida Glabrata: NEGATIVE
Candida Vaginitis: POSITIVE — AB
Chlamydia: NEGATIVE
Comment: NEGATIVE
Comment: NEGATIVE
Comment: NEGATIVE
Comment: NEGATIVE
Comment: NEGATIVE
Comment: NORMAL
Neisseria Gonorrhea: NEGATIVE
Trichomonas: NEGATIVE

## 2020-10-27 LAB — RPR: RPR Ser Ql: NONREACTIVE

## 2020-10-27 MED ORDER — METRONIDAZOLE 500 MG PO TABS: 500.0000 mg | ORAL_TABLET | Freq: Two times a day (BID) | ORAL | 0 refills | Status: DC

## 2020-10-27 MED ORDER — FLUCONAZOLE 150 MG PO TABS: 150.0000 mg | ORAL_TABLET | Freq: Once | ORAL | 0 refills | Status: AC

## 2020-10-28 ENCOUNTER — Other Ambulatory Visit: Payer: Self-pay | Admitting: Pediatrics

## 2020-10-28 DIAGNOSIS — J302 Other seasonal allergic rhinitis: Secondary | ICD-10-CM

## 2020-11-06 NOTE — L&D Delivery Note (Addendum)
OB/GYN Faculty Practice Delivery Note  Betty Logan is a 20 y.o. G1P0000 s/p SVD at [redacted]w[redacted]d. She was admitted for IOL for cHTN.   ROM: rupture date, rupture time, delivery date, or delivery time have not been documented with clear fluid GBS Status: Negative Maximum Maternal Temperature: 98.7  Delivery Date/Time: 10/26/2021 at 0025 Delivery: Called to room and patient was complete and pushing. Head delivered ROA with compound hand. No nuchal cord present. Shoulder and body delivered in usual fashion. Infant with spontaneous cry, placed on mother's abdomen, dried and stimulated. Cord clamped x 2 after 1-minute delay, and cut by Aunt of patient. Cord blood drawn. Placenta delivered spontaneously with gentle cord traction. Fundus firm with massage and Pitocin. Labia, perineum, vagina, and cervix inspected inspected with abrasions, superficial periurethral lacerations not requiring repair.   Placenta: 3VC, Intact- sent to L&D Complications: None Lacerations: Superficial periurethral, hemostatic without repair EBL: 100 cc Analgesia: Epidural   Infant: Viable   APGARs 8 & 9   pending weight  Darral Dash, D.O. [facility], PGY-1 10/26/2021, 12:59 AM   Attestation:  I confirm that I have verified the information documented in the residents note and that I have also personally reperformed the physical exam and all medical decision making activities.   I was gloved and present for entire delivery SVD without incident No difficulty with shoulders No lacerations  Aviva Signs, CNM

## 2020-12-14 ENCOUNTER — Encounter (HOSPITAL_COMMUNITY): Payer: Self-pay | Admitting: Emergency Medicine

## 2020-12-14 ENCOUNTER — Other Ambulatory Visit: Payer: Self-pay

## 2020-12-14 ENCOUNTER — Ambulatory Visit (HOSPITAL_COMMUNITY)
Admission: EM | Admit: 2020-12-14 | Discharge: 2020-12-14 | Disposition: A | Discharge status: Home / Self Care | Payer: Medicare Other | Attending: Family Medicine | Admitting: Family Medicine

## 2020-12-14 DIAGNOSIS — Z202 Contact with and (suspected) exposure to infections with a predominantly sexual mode of transmission: Secondary | ICD-10-CM | POA: Diagnosis present

## 2020-12-14 NOTE — ED Provider Notes (Signed)
[facility]    CSN: [id number] Arrival date & time: 12/14/20  1249      History   Chief Complaint Chief Complaint  Patient presents with  . SEXUALLY TRANSMITTED DISEASE    HPI Betty Logan is a 20 y.o. female.   Here today requesting STI testing as a partner recently told her they were having some discharge/sxs. She denies vaginal irritation, discharge, itching, flank pain, N/V/D. LMP 11/24/2020.      Past Medical History:  Diagnosis Date  . Deliberate medication overdose (HCC)   . Depression   . Hypertension     Patient Active Problem List   Diagnosis Date Noted  . Insomnia 02/19/2020  . 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 Nightmares 10/08/2014  . Oppositional defiant disorder 09/17/2014  . Post traumatic stress disorder (PTSD) 09/17/2014  . MDD (major depressive disorder), recurrent episode, moderate (HCC) 09/16/2014    Past Surgical History:  Procedure Laterality Date  . EYE SURGERY      OB History    Gravida  0   Para  0   Term  0   Preterm  0   AB  0   Living  0     SAB  0   IAB  0   Ectopic  0   Multiple  0   Live Births  0            Home Medications    Prior to Admission medications   Medication Sig Start Date End Date Taking? Authorizing Provider  cyclobenzaprine (FLEXERIL) 5 MG tablet Take 1 tablet (5 mg total) by mouth at bedtime as needed for muscle spasms. DO NOT DRINK ALCOHOL OR DRIVE WHILE TAKING THIS MEDICATION 10/24/20   Particia Nearing, PA-C  fluticasone [facility]) 50 MCG/ACT nasal spray PLACE 1 SPRAY INTO BOTH NOSTRILS DAILY. 10/28/20 11/27/20  Lady Deutscher, MD  metroNIDAZOLE (FLAGYL) 500 MG tablet Take 1 tablet (500 mg total) by mouth 2 (two) times daily. 10/27/20   Lamptey, Britta Mccreedy, MD  albuterol (VENTOLIN HFA) 108 (90 Base) MCG/ACT inhaler Inhale 2 puffs into the lungs every 4 (four) hours as needed for wheezing or shortness of breath. Patient not taking: No sig reported 05/21/20 10/14/20   Ettefagh, Aron Baba, MD  budesonide-formoterol [facility]) 80-4.5 MCG/ACT inhaler Inhale 2 puffs into the lungs in the morning and at bedtime. Patient not taking: Reported on 10/14/2020 02/18/20 10/14/20  Lady Deutscher, MD  cetirizine (ZYRTEC) 10 MG tablet Take 1 tablet (10 mg total) by mouth daily. Patient not taking: Reported on 10/14/2020 02/18/20 10/14/20  Lady Deutscher, MD  traZODone (DESYREL) 50 MG tablet Take 1 tablet (50 mg total) by mouth at bedtime as needed for sleep. Patient not taking: Reported on 05/27/2020 02/18/20 10/14/20  Lady Deutscher, MD    Family History Family History  Problem Relation Age of Onset  . Hypertension Mother     Social History Social History   Tobacco Use  . Smoking status: Former Games developer  . Smokeless tobacco: Never Used  Substance Use Topics  . Alcohol use: Yes  . Drug use: Yes    Types: Marijuana     Allergies   Cherry   Review of Systems Review of Systems PER HPI   Physical Exam Triage Vital Signs ED Triage Vitals  Enc Vitals Group     BP 12/14/20 1322 112/74     Pulse Rate 12/14/20 1322 82     Resp 12/14/20 1322 16     Temp 12/14/20  1322 99.4 F (37.4 C)     Temp Source 12/14/20 1322 Oral     SpO2 12/14/20 1322 98 %     Weight --      Height --      Head Circumference --      Peak Flow --      Pain Score 12/14/20 1320 0     Pain Loc --      Pain Edu? --      Excl. in GC? --    No data found.  Updated Vital Signs BP 112/74 (BP Location: Right Arm)   Pulse 82   Temp 99.4 F (37.4 C) (Oral)   Resp 16   LMP 11/24/2020   SpO2 98%   Visual Acuity Right Eye Distance:   Left Eye Distance:   Bilateral Distance:    Right Eye Near:   Left Eye Near:    Bilateral Near:     Physical Exam Vitals and nursing note reviewed.  Constitutional:      Appearance: Normal appearance. She is not ill-appearing.  HENT:     Head: Atraumatic.  Eyes:     Extraocular Movements: Extraocular movements intact.      Conjunctiva/sclera: Conjunctivae normal.  Cardiovascular:     Rate and Rhythm: Normal rate and regular rhythm.     Heart sounds: Normal heart sounds.  Pulmonary:     Effort: Pulmonary effort is normal.     Breath sounds: Normal breath sounds.  Abdominal:     General: Bowel sounds are normal. There is no distension.     Palpations: Abdomen is soft.     Tenderness: There is no abdominal tenderness. There is no guarding.  Genitourinary:    Comments: GU exam deferred, self swab performed Musculoskeletal:        General: Normal range of motion.     Cervical back: Normal range of motion and neck supple.  Skin:    General: Skin is warm and dry.  Neurological:     Mental Status: She is alert and oriented to person, place, and time.  Psychiatric:        Mood and Affect: Mood normal.        Thought Content: Thought content normal.        Judgment: Judgment normal.     UC Treatments / Results  Labs (all labs ordered are listed, but only abnormal results are displayed) Labs Reviewed  CERVICOVAGINAL ANCILLARY ONLY    EKG   Radiology No results found.  Procedures Procedures (including critical care time)  Medications Ordered in UC Medications - No data to display  Initial Impression / Assessment and Plan / UC Course  I have reviewed the triage vital signs and the nursing notes.  Pertinent labs & imaging results that were available during my care of the patient were reviewed by me and considered in my medical decision making (see chart for details).     Aptima swab pending, discussed will treat based on results. Abstinence until results return.   Final Clinical Impressions(s) / UC Diagnoses   Final diagnoses:  Possible exposure to STD   Discharge Instructions   None    ED Prescriptions    None     PDMP not reviewed this encounter.   Particia Nearing, [place] 12/14/20 1351

## 2020-12-14 NOTE — ED Triage Notes (Signed)
Partner has complaints of discharge.  Patient says she does not have any symptoms of an std

## 2020-12-15 LAB — CERVICOVAGINAL ANCILLARY ONLY
Bacterial Vaginitis (gardnerella): POSITIVE — AB
Candida Glabrata: NEGATIVE
Candida Vaginitis: POSITIVE — AB
Chlamydia: NEGATIVE
Comment: NEGATIVE
Comment: NEGATIVE
Comment: NEGATIVE
Comment: NEGATIVE
Comment: NEGATIVE
Comment: NORMAL
Neisseria Gonorrhea: POSITIVE — AB
Trichomonas: NEGATIVE

## 2020-12-16 ENCOUNTER — Telehealth (HOSPITAL_COMMUNITY): Payer: Self-pay | Admitting: Emergency Medicine

## 2020-12-16 ENCOUNTER — Other Ambulatory Visit: Payer: Self-pay

## 2020-12-16 ENCOUNTER — Encounter (HOSPITAL_COMMUNITY): Payer: Self-pay

## 2020-12-16 ENCOUNTER — Ambulatory Visit (HOSPITAL_COMMUNITY)
Admission: RE | Admit: 2020-12-16 | Discharge: 2020-12-16 | Disposition: A | Discharge status: Home / Self Care | Payer: Medicare Other | Source: Ambulatory Visit | Attending: Emergency Medicine | Admitting: Emergency Medicine

## 2020-12-16 DIAGNOSIS — A549 Gonococcal infection, unspecified: Secondary | ICD-10-CM

## 2020-12-16 MED ORDER — LIDOCAINE HCL (PF) 1 % IJ SOLN
INTRAMUSCULAR | Status: AC
  Filled 2020-12-16: qty 2

## 2020-12-16 MED ORDER — CEFTRIAXONE SODIUM 500 MG IJ SOLR
500.0000 mg | Freq: Once | INTRAMUSCULAR | Status: AC
  Administered 2020-12-16: 500 mg via INTRAMUSCULAR

## 2020-12-16 MED ORDER — CEFTRIAXONE SODIUM 500 MG IJ SOLR
INTRAMUSCULAR | Status: AC
  Filled 2020-12-16: qty 500

## 2020-12-16 MED ORDER — METRONIDAZOLE 500 MG PO TABS: 500.0000 mg | ORAL_TABLET | Freq: Two times a day (BID) | ORAL | 0 refills | Status: DC

## 2020-12-16 MED ORDER — FLUCONAZOLE 150 MG PO TABS: 150.0000 mg | ORAL_TABLET | Freq: Once | ORAL | 0 refills | Status: AC

## 2020-12-16 NOTE — ED Triage Notes (Signed)
Patient presents to Urgent Care on 02/08 for STD testing. She states she never received a callback from the nurse regarding test results. She is here for a follow-up appt.

## 2020-12-16 NOTE — ED Provider Notes (Signed)
Nurse visit only.  Needs treatment for gonorrhea.  Prescription sent for treatment of bacterial vaginosis and yeast infection already.   Wallis Bamberg, [place] 12/16/20 1801

## 2021-01-27 ENCOUNTER — Ambulatory Visit: Payer: Medicaid Other | Admitting: Pediatrics

## 2021-02-19 ENCOUNTER — Other Ambulatory Visit: Payer: Self-pay

## 2021-02-19 ENCOUNTER — Inpatient Hospital Stay (HOSPITAL_COMMUNITY)
Admission: AD | Admit: 2021-02-19 | Discharge: 2021-02-19 | Disposition: A | Discharge status: Home / Self Care | Payer: Medicare Other | Attending: Obstetrics & Gynecology | Admitting: Obstetrics & Gynecology

## 2021-02-19 DIAGNOSIS — Z3200 Encounter for pregnancy test, result unknown: Secondary | ICD-10-CM

## 2021-02-19 NOTE — MAU Note (Signed)
Patient reports a positive pregnancy test today. Reports leg cramping for the past three days at night. Denies abdominal pain. Denies vaginal bleeding. She reports she is here to confirm her pregnancy and see how far along she is.   LMP: 01/17/21 Today's Vitals   02/19/21 1946  BP: 133/85  Pulse: (!) 110  Resp: 17  Temp: 98.5 F (36.9 C)  TempSrc: Oral  SpO2: 100%  Weight: 56.4 kg  Height: 5\' 6"  (1.676 m)   Body mass index is 20.08 kg/m.

## 2021-02-19 NOTE — MAU Provider Note (Signed)
S:   19 y.o. G0P0000 @Unknown  by LMP presents to MAU for pregnancy confirmation.  She denies abdominal pain or vaginal bleeding today.  Having bilateral leg cramps.   O: BP 133/85 (BP Location: Right Arm)   Pulse (!) 110   Temp 98.5 F (36.9 C) (Oral)   Resp 17   Ht 5\' 6"  (1.676 m)   Wt 56.4 kg   SpO2 100%   BMI 20.08 kg/m  Physical Examination: General appearance - alert, well appearing, and in no distress, oriented to person, place, and time and acyanotic, in no respiratory distress  No results found for this or any previous visit (from the past 48 hour(s)).  A: Missed menses Encounter for pregnancy test.  P: D/C home Informed pt we do not do pregnancy verifications in MAU Referred to [facility] for pregnancy test & verification Return to MAU as needed for pregnancy related emergencies  , NP 8:52 PM

## 2021-02-21 ENCOUNTER — Ambulatory Visit (INDEPENDENT_AMBULATORY_CARE_PROVIDER_SITE_OTHER): Payer: Medicare Other | Admitting: *Deleted

## 2021-02-21 ENCOUNTER — Encounter: Payer: Self-pay | Admitting: *Deleted

## 2021-02-21 ENCOUNTER — Encounter: Payer: Self-pay | Admitting: Family Medicine

## 2021-02-21 VITALS — BP 122/83 | HR 82 | Ht 66.0 in

## 2021-02-21 DIAGNOSIS — Z3200 Encounter for pregnancy test, result unknown: Secondary | ICD-10-CM

## 2021-02-21 DIAGNOSIS — Z3201 Encounter for pregnancy test, result positive: Secondary | ICD-10-CM

## 2021-02-21 LAB — POCT PREGNANCY, URINE: Preg Test, Ur: POSITIVE — AB

## 2021-02-21 MED ORDER — PRENATAL VITAMINS 28-0.8 MG PO TABS: 1.0000 | ORAL_TABLET | Freq: Every day | ORAL | 12 refills | Status: DC

## 2021-02-21 NOTE — Progress Notes (Signed)
Pt presents for pregnancy test. She was informed of positive result. Pt reports sure LMP 01/17/21 which yields EDD 10/24/21, now [redacted]w[redacted]d.  She denies vaginal bleeding or abdominal pain. Pt was advised to begin prenatal vitamins and Rx was e-prescribed. Pt was given list of Ob providers and states she will schedule prenatal care once she has chosen her desired provider. Pt was instructed to go to MAU if she develops heavy vaginal bleeding or abdominal pain. She voiced understanding of all information and instructions given.

## 2021-03-05 NOTE — Progress Notes (Signed)
Chart reviewed for nurse visit. Agree with plan of care.   Marylene Land, CNM 03/05/2021 9:40 PM

## 2021-03-23 ENCOUNTER — Ambulatory Visit (INDEPENDENT_AMBULATORY_CARE_PROVIDER_SITE_OTHER): Payer: Medicare Other

## 2021-03-23 ENCOUNTER — Other Ambulatory Visit: Payer: Self-pay

## 2021-03-23 VITALS — BP 119/80 | HR 79 | Ht 66.0 in | Wt 115.2 lb

## 2021-03-23 DIAGNOSIS — O3680X0 Pregnancy with inconclusive fetal viability, not applicable or unspecified: Secondary | ICD-10-CM | POA: Diagnosis not present

## 2021-03-23 DIAGNOSIS — Z3401 Encounter for supervision of normal first pregnancy, first trimester: Secondary | ICD-10-CM

## 2021-03-23 DIAGNOSIS — O0990 Supervision of high risk pregnancy, unspecified, unspecified trimester: Secondary | ICD-10-CM

## 2021-03-23 DIAGNOSIS — Z3A09 9 weeks gestation of pregnancy: Secondary | ICD-10-CM

## 2021-03-23 HISTORY — DX: Supervision of high risk pregnancy, unspecified, unspecified trimester: O09.90

## 2021-03-23 MED ORDER — BLOOD PRESSURE KIT DEVI: 1.0000 | 0 refills | Status: DC

## 2021-03-23 NOTE — Progress Notes (Signed)
New OB Intake  I connected with  Betty Logan on 03/23/21 at  1:15 PM EDT by in person visit and verified that I am speaking with the correct person using two identifiers. Nurse is located at [facility] and pt is located at in office.  I discussed the limitations, risks, security and privacy concerns of performing an evaluation and management service by telephone and the availability of in person appointments. I also discussed with the patient that there may be a patient responsible charge related to this service. The patient expressed understanding and agreed to proceed.  I explained I am completing New OB Intake today. We discussed her EDD of 10/24/21 that is based on LMP of 01/17/21. Pt is G1/P0. I reviewed her allergies, medications, Medical/Surgical/OB history, and appropriate screenings. I informed her of [facility] services. Based on history, this is a/an complicated by hypertension pregnancy.  Patient Active Problem List   Diagnosis Date Noted  . Encounter for supervision of normal first pregnancy in first trimester 03/23/2021  . Insomnia 02/19/2020  . 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 Nightmares 10/08/2014  . Oppositional defiant disorder 09/17/2014  . Post traumatic stress disorder (PTSD) 09/17/2014  . MDD (major depressive disorder), recurrent episode, moderate (HCC) 09/16/2014    Concerns addressed today  Delivery Plans:  Plans to deliver at [facility].   MyChart/Babyscripts MyChart access verified. I explained pt will have some visits in office and some virtually. Babyscripts instructions given and order placed. Patient verifies receipt of registration text/e-mail. Account successfully created and app downloaded.  Blood Pressure Cuff Blood pressure cuff ordered for patient to pick-up from [employer]. Explained after first prenatal appt pt will check weekly and document in Babyscripts.  Anatomy US Explained first scheduled [place] will be around 19 weeks. Dating and viability  scan performed today  Labs Discussed Avelina Laine genetic screening with patient. Would like both Panorama and Horizon drawn at new OB visit. Routine prenatal labs needed.  Covid Vaccine Patient has covid vaccine.   Social Determinants of Health . Food Insecurity: Patient denies food insecurity. . WIC Referral: Patient is interested in referral to [facility].  . Transportation: Patient denies transportation needs. . Childcare: Discussed no children allowed at ultrasound appointments. Offered childcare services; patient declines childcare services at this time.  First visit review I reviewed new OB appt with pt. I explained she will have a pelvic exam, ob bloodwork with genetic screening, and PAP smear. Explained pt will be seen by Donia Ast at first visit; encounter routed to appropriate provider. Explained that patient will be seen by pregnancy navigator following visit with provider.  PHQ9 score: 1 GAD 7 score: 3  Hamilton Capri, RN 03/23/2021  1:31 PM

## 2021-03-23 NOTE — Progress Notes (Signed)
Patient was assessed and managed by nursing staff during this encounter. I have reviewed the chart and agree with the documentation and plan. I have also made any necessary editorial changes.  Catalina Antigua, MD 03/23/2021 4:26 PM

## 2021-03-30 ENCOUNTER — Other Ambulatory Visit: Payer: Self-pay

## 2021-03-30 ENCOUNTER — Other Ambulatory Visit (HOSPITAL_COMMUNITY)
Admission: RE | Admit: 2021-03-30 | Discharge: 2021-03-30 | Disposition: A | Discharge status: Home / Self Care | Payer: Medicare Other | Source: Ambulatory Visit | Attending: Women's Health | Admitting: Women's Health

## 2021-03-30 ENCOUNTER — Ambulatory Visit (INDEPENDENT_AMBULATORY_CARE_PROVIDER_SITE_OTHER): Payer: Medicare Other | Admitting: Women's Health

## 2021-03-30 VITALS — BP 117/80 | HR 84 | Wt 116.0 lb

## 2021-03-30 DIAGNOSIS — Z3401 Encounter for supervision of normal first pregnancy, first trimester: Secondary | ICD-10-CM

## 2021-03-30 DIAGNOSIS — Z3A10 10 weeks gestation of pregnancy: Secondary | ICD-10-CM | POA: Diagnosis not present

## 2021-03-30 DIAGNOSIS — Z227 Latent tuberculosis: Secondary | ICD-10-CM

## 2021-03-30 DIAGNOSIS — F331 Major depressive disorder, recurrent, moderate: Secondary | ICD-10-CM

## 2021-03-30 DIAGNOSIS — R030 Elevated blood-pressure reading, without diagnosis of hypertension: Secondary | ICD-10-CM | POA: Diagnosis present

## 2021-03-30 DIAGNOSIS — F913 Oppositional defiant disorder: Secondary | ICD-10-CM

## 2021-03-30 DIAGNOSIS — F4310 Post-traumatic stress disorder, unspecified: Secondary | ICD-10-CM

## 2021-03-30 DIAGNOSIS — O219 Vomiting of pregnancy, unspecified: Secondary | ICD-10-CM | POA: Insufficient documentation

## 2021-03-30 DIAGNOSIS — O361910 Maternal care for other isoimmunization, first trimester, not applicable or unspecified: Secondary | ICD-10-CM

## 2021-03-30 DIAGNOSIS — O10919 Unspecified pre-existing hypertension complicating pregnancy, unspecified trimester: Secondary | ICD-10-CM | POA: Insufficient documentation

## 2021-03-30 HISTORY — DX: Vomiting of pregnancy, unspecified: O21.9

## 2021-03-30 LAB — HEPATITIS C ANTIBODY: HCV Ab: NEGATIVE

## 2021-03-30 MED ORDER — DOXYLAMINE-PYRIDOXINE 10-10 MG PO TBEC: 2.0000 | DELAYED_RELEASE_TABLET | Freq: Every day | ORAL | 1 refills | Status: DC

## 2021-03-30 MED ORDER — ASPIRIN EC 81 MG PO TBEC: 81.0000 mg | DELAYED_RELEASE_TABLET | Freq: Every day | ORAL | 7 refills | Status: DC

## 2021-03-30 NOTE — Progress Notes (Signed)
History:   Betty Logan is a 20 y.o. G1P0000 at 23w2dby LMP being seen today for her first obstetrical visit.  Her obstetrical history is significant for hx cHTN. Patient does intend to breast feed. Pregnancy history fully reviewed.  Allergies: NKDA Current Medications: none PMH: hx HTN (has been on BP meds in the past, not currently). No DM, asthma. 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 Family Hx: none  Patient reports nausea, vomiting and pt reports occasionally she will experience days where she struggles with nausea and vomiting. Patient reports this does not happen every day. On the days she does struggle she will feel nauseous and vomit about 4-5 times. Patient reports she is able to eat salty foods like chips still on those days, and a more varied diet on the days she does not experience nausea and vomiting.      HISTORY: OB History  Gravida Para Term Preterm AB Living  1 0 0 0 0 0  SAB IAB Ectopic Multiple Live Births  0 0 0 0 0    # Outcome Date GA Lbr Len/2nd Weight Sex Delivery Anes PTL Lv  1 Current             Last pap smear was n/a age 20  Past Medical History:  Diagnosis Date  . Deliberate medication overdose ([facility]   . Depression   . Hypertension    Past Surgical History:  Procedure Laterality Date  . EYE SURGERY     Family History  Problem Relation Age of Onset  . Hypertension Mother    Social History   Tobacco Use  . Smoking status: Former SResearch scientist (life sciences) . Smokeless tobacco: Never Used  Vaping Use  . Vaping Use: Never used  Substance Use Topics  . Alcohol use: Not Currently    Comment: not since confirmed pregnancy  . Drug use: Not Currently    Types: Marijuana    Comment: not since confirmed pregnancy   Allergies  Allergen Reactions  . Cherry Swelling    pineapples   Current Outpatient Medications on File Prior to Visit  Medication Sig Dispense Refill  . Blood Pressure  Monitoring (BLOOD PRESSURE KIT) DEVI 1 kit by Does not apply route once a week. 1 each 0  . Prenatal Vit-Fe Fumarate-FA (PRENATAL VITAMINS) 28-0.8 MG TABS Take 1 tablet by mouth daily. 30 tablet 12  . [DISCONTINUED] albuterol (VENTOLIN HFA) 108 (90 Base) MCG/ACT inhaler Inhale 2 puffs into the lungs every 4 (four) hours as needed for wheezing or shortness of breath. (Patient not taking: No sig reported) 18 g 0  . [DISCONTINUED] budesonide-formoterol (SYMBICORT) 80-4.5 MCG/ACT inhaler Inhale 2 puffs into the lungs in the morning and at bedtime. (Patient not taking: Reported on 10/14/2020) 1 Inhaler 12  . [DISCONTINUED] cetirizine (ZYRTEC) 10 MG tablet Take 1 tablet (10 mg total) by mouth daily. (Patient not taking: Reported on 10/14/2020) 90 tablet 2  . [DISCONTINUED] traZODone (DESYREL) 50 MG tablet Take 1 tablet (50 mg total) by mouth at bedtime as needed for sleep. (Patient not taking: Reported on 05/27/2020) 90 tablet 3   No current facility-administered medications on file prior to visit.    Review of Systems Pertinent items noted in HPI and remainder of comprehensive ROS otherwise negative.  Physical Exam:   Vitals:   03/30/21 0952 03/30/21 1045  BP: (!) 132/93 117/80  Pulse: 96 84  Weight: 116  lb (52.6 kg)    Fetal Heart Rate (bpm): 159  General: well-developed, well-nourished female in no acute distress  Breasts:  normal appearance, no masses or tenderness bilaterally  Skin: normal coloration and turgor, no rashes  Neurologic: oriented, normal, negative, normal mood  Extremities: normal strength, tone, and muscle mass, ROM of all joints is normal  HEENT PERRLA, extraocular movement intact and sclera clear, anicteric  Neck supple and no masses  Cardiovascular: regular rate and rhythm  Respiratory:  no respiratory distress, normal breath sounds  Abdomen: soft, non-tender; bowel sounds normal; no masses,  no organomegaly  Pelvic: Not indicated      Assessment:    Pregnancy:  G1P0000 Patient Active Problem List   Diagnosis Date Noted  . Chronic hypertension affecting pregnancy 03/30/2021  . Nausea and vomiting in pregnancy 03/30/2021  . Encounter for supervision of normal first pregnancy in first trimester 03/23/2021  . Insomnia 02/19/2020  . 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 Nightmares 10/08/2014  . Oppositional defiant disorder 09/17/2014  . Post traumatic stress disorder (PTSD) 09/17/2014  . MDD (major depressive disorder), recurrent episode, moderate ([place]) 09/16/2014     Plan:    1. Elevated blood pressure reading without diagnosis of hypertension - Cervicovaginal ancillary only( [place]) - Genetic Screening - Culture, OB Urine - Obstetric Panel, Including HIV - Hepatitis C Antibody - [place] MFM OB DETAIL +14 WK; Future - Comp Met (CMET) - Protein / creatinine ratio, urine  2. Encounter for supervision of normal first pregnancy in first trimester  3. Latent tuberculosis - QuantiFERON-TB Gold Plus  4. [redacted] weeks gestation of pregnancy  5. MDD (major depressive disorder), recurrent episode, moderate ([place]) - Ambulatory referral to [place]  6. Oppositional defiant disorder - Ambulatory referral to [place]  7. Post traumatic stress disorder (PTSD) - Ambulatory referral to [place]  8. Nausea and vomiting in pregnancy - Doxylamine-Pyridoxine (DICLEGIS) 10-10 MG TBEC; Take 2 tablets by mouth at bedtime.  Dispense: 120 tablet; Refill: 1  9. Chronic hypertension affecting pregnancy - BP today 132/93, repeat 117/80, will repeat BP in one week to determine if BP meds should be started - RX low dose ASA, pt advised to start at 12 weeks - baseline labs drawn today  Initial labs drawn. Continue prenatal vitamins. Problem list reviewed and updated. Genetic Screening discussed, NIPS: ordered. Ultrasound discussed; fetal anatomic survey: ordered. Anticipatory guidance about prenatal  visits given including labs, ultrasounds, and testing. Discussed usage of Babyscripts and virtual visits as additional source of managing and completing prenatal visits in midst of coronavirus and pandemic.   Encouraged to complete MyChart Registration for her ability to review results, send requests, and have questions addressed.  The nature of [place] [facility] with multiple MDs and Advanced Practice Providers was explained to patient; also emphasized that residents, students are part of our team. Routine obstetric precautions reviewed. Encouraged to seek out care at office or emergency room [facility] preferred) for urgent and/or emergent concerns. Return in about 4 weeks (around 04/27/2021) for in-person LOB/APP OK, return in one week for BP check.     Olia Hinderliter, Gerrie Nordmann, NP  1:15 PM 03/30/2021

## 2021-03-30 NOTE — Progress Notes (Signed)
NOB, reports no problems today. 

## 2021-03-30 NOTE — Patient Instructions (Signed)
Maternity Assessment Unit (MAU)  The Maternity Assessment Unit (MAU) is located at the [facility] and [place] [facility]. The address is: [address], [place], [place], [place] [address]. Please see map below for additional directions.    The Maternity Assessment Unit is designed to help you during your pregnancy, and for up to 6 weeks after delivery, with any pregnancy- or postpartum-related emergencies, if you think you are in labor, or if your water has broken. For example, if you experience nausea and vomiting, vaginal bleeding, severe abdominal or pelvic pain, elevated blood pressure or other problems related to your pregnancy or postpartum time, please come to the Maternity Assessment Unit for assistance.        Preterm Labor The normal length of a pregnancy is 39-41 weeks. Preterm labor is when labor starts before 37 completed weeks of pregnancy. Babies who are born prematurely and survive may not be fully developed and may be at an increased risk for long-term problems such as cerebral palsy, developmental delays, and vision and hearing problems. Babies who are born too early may have problems soon after birth. Problems may include regulating blood sugar, body temperature, heart rate, and breathing rate. These babies often have trouble with feeding. The risk of having problems is highest for babies who are born before 44 weeks of pregnancy. What are the causes? The exact cause of this condition is not known. What increases the risk? You are more likely to have preterm labor if you have certain risk factors that relate to your medical history, problems with present and past pregnancies, and lifestyle factors. Medical history  You have abnormalities of the uterus, including a short cervix.  You have STIs (sexually transmitted infections), or other infections of the urinary tract and the vagina.  You have chronic illnesses, such as blood clotting problems,  diabetes, or high blood pressure.  You are overweight or underweight. Present and past pregnancies  You have had preterm labor before.  You are pregnant with twins or other multiples.  You have been diagnosed with a condition in which the placenta covers your cervix (placenta previa).  You waited less than 6 months between giving birth and becoming pregnant again.  Your unborn baby has some abnormalities.  You have vaginal bleeding during pregnancy.  You became pregnant through in vitro fertilization (IVF). Lifestyle and environmental factors  You use tobacco products.  You drink alcohol.  You use street drugs.  You have stress and no social support.  You experience domestic violence.  You are exposed to certain chemicals or environmental pollutants. Other factors  You are younger than age 110 or older than age 19. What are the signs or symptoms? Symptoms of this condition include:  Cramps similar to those that can happen during a menstrual period. The cramps may happen with diarrhea.  Pain in the abdomen or lower back.  Regular contractions that may feel like tightening of the abdomen.  A feeling of increased pressure in the pelvis.  Increased watery or bloody mucus discharge from the vagina.  Water breaking (ruptured amniotic sac). How is this diagnosed? This condition is diagnosed based on:  Your medical history and a physical exam.  A pelvic exam.  An ultrasound.  Monitoring your uterus for contractions.  Other tests, including: ? A swab of the cervix to check for a chemical called fetal fibronectin. ? Urine tests. How is this treated? Treatment for this condition depends on the length of your pregnancy, your  condition, and the health of your baby. Treatment may include:  Taking medicines, such as: ? Hormone medicines. These may be given early in pregnancy to help support the pregnancy. ? Medicines to stop contractions. ? Medicines to help mature  the baby's lungs. These may be prescribed if the risk of delivery is high. ? Medicines to prevent your baby from developing cerebral palsy.  Bed rest. If the labor happens before 34 weeks of pregnancy, you may need to stay in the hospital.  Delivery of the baby. Follow these instructions at home:  Do not use any products that contain nicotine or tobacco, such as cigarettes, e-cigarettes, and chewing tobacco. If you need help quitting, ask your health care provider.  Do not drink alcohol.  Take over-the-counter and prescription medicines only as told by your health care provider.  Rest as told by your health care provider.  Return to your normal activities as told by your health care provider. Ask your health care provider what activities are safe for you.  Keep all follow-up visits as told by your health care provider. This is important.   How is this prevented? To increase your chance of having a full-term pregnancy:  Do not use street drugs or medicines that have not been prescribed to you during your pregnancy.  Talk with your health care provider before taking any herbal supplements, even if you have been taking them regularly.  Make sure you gain a healthy amount of weight during your pregnancy.  Watch for infection. If you think that you might have an infection, get it checked right away. Symptoms of infection may include: ? Fever. ? Abnormal vaginal discharge or discharge that smells bad. ? Pain or burning with urination. ? Needing to urinate urgently. ? Frequently urinating or passing small amounts of urine frequently. ? Blood in your urine. ? Urine that smells bad or unusual.  Tell your health care provider if you have had preterm labor before. Contact a health care provider if:  You think you are going into preterm labor.  You have signs or symptoms of preterm labor.  You have symptoms of infection. Get help right away if:  You are having regular, painful  contractions every 5 minutes or less.  Your water breaks. Summary  Preterm labor is labor that starts before you reach 37 weeks of pregnancy.  Delivering your baby early increases your baby's risk of developing lifelong problems.  The exact cause of preterm labor is unknown. However, having an abnormal uterus, an STI (sexually transmitted infection), or vaginal bleeding during pregnancy increases your risk for preterm labor.  Keep all follow-up visits as told by your health care provider. This is important.  Contact a health care provider if you have signs or symptoms of preterm labor. This information is not intended to replace advice given to you by your health care provider. Make sure you discuss any questions you have with your health care provider. Document Revised: 11/25/2019 Document Reviewed: 11/25/2019 Elsevier Patient Education  2021 Elsevier Inc.        Obstetrics: Normal and Problem Pregnancies (7th ed., pp. 102-121). [place], [place]: Elsevier."> Textbook of Family Medicine (9th ed., pp. [phone]). [place], [place]: Elsevier [employer].">  First Trimester of Pregnancy  The first trimester of pregnancy starts on the first day of your last menstrual period until the end of week 12. This is months 1 through 3 of pregnancy. A week after a sperm fertilizes an egg, the egg will implant into the wall of  the uterus and begin to develop into a baby. By the end of 12 weeks, all the baby's organs will be formed and the baby will be 2-3 inches in size. Body changes during your first trimester Your body goes through many changes during pregnancy. The changes vary and generally return to normal after your baby is born. Physical changes  You may gain or lose weight.  Your breasts may begin to grow larger and become tender. The tissue that surrounds your nipples (areola) may become darker.  Dark spots or blotches (chloasma or mask of pregnancy) may develop on your face.  You may have  changes in your hair. These can include thickening or thinning of your hair or changes in texture. Health changes  You may feel nauseous, and you may vomit.  You may have heartburn.  You may develop headaches.  You may develop constipation.  Your gums may bleed and may be sensitive to brushing and flossing. Other changes  You may tire easily.  You may urinate more often.  Your menstrual periods will stop.  You may have a loss of appetite.  You may develop cravings for certain kinds of food.  You may have changes in your emotions from day to day.  You may have more vivid and strange dreams. Follow these instructions at home: Medicines  Follow your health care provider's instructions regarding medicine use. Specific medicines may be either safe or unsafe to take during pregnancy. Do not take any medicines unless told to by your health care provider.  Take a prenatal vitamin that contains at least 600 micrograms (mcg) of folic acid. Eating and drinking  Eat a healthy diet that includes fresh fruits and vegetables, whole grains, good sources of protein such as meat, eggs, or tofu, and low-fat dairy products.  Avoid raw meat and unpasteurized juice, milk, and cheese. These carry germs that can harm you and your baby.  If you feel nauseous or you vomit: ? Eat 4 or 5 small meals a day instead of 3 large meals. ? Try eating a few soda crackers. ? Drink liquids between meals instead of during meals.  You may need to take these actions to prevent or treat constipation: ? Drink enough fluid to keep your urine pale yellow. ? Eat foods that are high in fiber, such as beans, whole grains, and fresh fruits and vegetables. ? Limit foods that are high in fat and processed sugars, such as fried or sweet foods. Activity  Exercise only as directed by your health care provider. Most people can continue their usual exercise routine during pregnancy. Try to exercise for 30 minutes at least  5 days a week.  Stop exercising if you develop pain or cramping in the lower abdomen or lower back.  Avoid exercising if it is very hot or humid or if you are at high altitude.  Avoid heavy lifting.  If you choose to, you may have sex unless your health care provider tells you not to. Relieving pain and discomfort  Wear a good support bra to relieve breast tenderness.  Rest with your legs elevated if you have leg cramps or low back pain.  If you develop bulging veins (varicose veins) in your legs: ? Wear support hose as told by your health care provider. ? Elevate your feet for 15 minutes, 3-4 times a day. ? Limit salt in your diet. Safety  Wear your seat belt at all times when driving or riding in a car.  Talk with  your health care provider if someone is verbally or physically abusive to you.  Talk with your health care provider if you are feeling sad or have thoughts of hurting yourself. Lifestyle  Do not use hot tubs, steam rooms, or saunas.  Do not douche. Do not use tampons or scented sanitary pads.  Do not use herbal remedies, alcohol, illegal drugs, or medicines that are not approved by your health care provider. Chemicals in these products can harm your baby.  Do not use any products that contain nicotine or tobacco, such as cigarettes, e-cigarettes, and chewing tobacco. If you need help quitting, ask your health care provider.  Avoid cat litter boxes and soil used by cats. These carry germs that can cause birth defects in the baby and possibly loss of the unborn baby (fetus) by miscarriage or stillbirth. General instructions  During routine prenatal visits in the first trimester, your health care provider will do a physical exam, perform necessary tests, and ask you how things are going. Keep all follow-up visits. This is important.  Ask for help if you have counseling or nutritional needs during pregnancy. Your health care provider can offer advice or refer you to  specialists for help with various needs.  Schedule a dentist appointment. At home, brush your teeth with a soft toothbrush. Floss gently.  Write down your questions. Take them to your prenatal visits. Where to find more information  American Pregnancy Association: americanpregnancy.org  [employer] Gynecologists: https://www.todd-brady.net/  Office on [employer] Health: [employer].nz Contact a health care provider if you have:  Dizziness.  A fever.  Mild pelvic cramps, pelvic pressure, or nagging pain in the abdominal area.  Nausea, vomiting, or diarrhea that lasts for 24 hours or longer.  A bad-smelling vaginal discharge.  Pain when you urinate.  Known exposure to a contagious illness, such as chickenpox, measles, Zika virus, HIV, or hepatitis. Get help right away if you have:  Spotting or bleeding from your vagina.  Severe abdominal cramping or pain.  Shortness of breath or chest pain.  Any kind of trauma, such as from a fall or a car crash.  New or increased pain, swelling, or redness in an arm or leg. Summary  The first trimester of pregnancy starts on the first day of your last menstrual period until the end of week 12 (months 1 through 3).  Eating 4 or 5 small meals a day rather than 3 large meals may help to relieve nausea and vomiting.  Do not use any products that contain nicotine or tobacco, such as cigarettes, e-cigarettes, and chewing tobacco. If you need help quitting, ask your health care provider.  Keep all follow-up visits. This is important. This information is not intended to replace advice given to you by your health care provider. Make sure you discuss any questions you have with your health care provider. Document Revised: 03/31/2020 Document Reviewed: 02/05/2020 Elsevier Patient Education  2021 Elsevier Inc.        Second Trimester of Pregnancy  The second trimester of pregnancy is from  week 13 through week 27. This is months 4 through 6 of pregnancy. The second trimester is often a time when you feel your best. Your body has adjusted to being pregnant, and you begin to feel better physically. During the second trimester:  Morning sickness has lessened or stopped completely.  You may have more energy.  You may have an increase in appetite. The second trimester is also a time when the  unborn baby (fetus) is growing rapidly. At the end of the sixth month, the fetus may be up to 12 inches long and weigh about 1 pounds. You will likely begin to feel the baby move (quickening) between 16 and 20 weeks of pregnancy. Body changes during your second trimester Your body continues to go through many changes during your second trimester. The changes vary and generally return to normal after the baby is born. Physical changes  Your weight will continue to increase. You will notice your lower abdomen bulging out.  You may begin to get stretch marks on your hips, abdomen, and breasts.  Your breasts will continue to grow and to become tender.  Dark spots or blotches (chloasma or mask of pregnancy) may develop on your face.  A dark line from your belly button to the pubic area (linea nigra) may appear.  You may have changes in your hair. These can include thickening of your hair, rapid growth, and changes in texture. Some people also have hair loss during or after pregnancy, or hair that feels dry or thin. Health changes  You may develop headaches.  You may have heartburn.  You may develop constipation.  You may develop hemorrhoids or swollen, bulging veins (varicose veins).  Your gums may bleed and may be sensitive to brushing and flossing.  You may urinate more often because the fetus is pressing on your bladder.  You may have back pain. This is caused by: ? Weight gain. ? Pregnancy hormones that are relaxing the joints in your pelvis. ? A shift in weight and the muscles  that support your balance. Follow these instructions at home: Medicines  Follow your health care provider's instructions regarding medicine use. Specific medicines may be either safe or unsafe to take during pregnancy. Do not take any medicines unless approved by your health care provider.  Take a prenatal vitamin that contains at least 600 micrograms (mcg) of folic acid. Eating and drinking  Eat a healthy diet that includes fresh fruits and vegetables, whole grains, good sources of protein such as meat, eggs, or tofu, and low-fat dairy products.  Avoid raw meat and unpasteurized juice, milk, and cheese. These carry germs that can harm you and your baby.  You may need to take these actions to prevent or treat constipation: ? Drink enough fluid to keep your urine pale yellow. ? Eat foods that are high in fiber, such as beans, whole grains, and fresh fruits and vegetables. ? Limit foods that are high in fat and processed sugars, such as fried or sweet foods. Activity  Exercise only as directed by your health care provider. Most people can continue their usual exercise routine during pregnancy. Try to exercise for 30 minutes at least 5 days a week. Stop exercising if you develop contractions in your uterus.  Stop exercising if you develop pain or cramping in the lower abdomen or lower back.  Avoid exercising if it is very hot or humid or if you are at a high altitude.  Avoid heavy lifting.  If you choose to, you may have sex unless your health care provider tells you not to. Relieving pain and discomfort  Wear a supportive bra to prevent discomfort from breast tenderness.  Take warm sitz baths to soothe any pain or discomfort caused by hemorrhoids. Use hemorrhoid cream if your health care provider approves.  Rest with your legs raised (elevated) if you have leg cramps or low back pain.  If you develop varicose veins: ?  Wear support hose as told by your health care  provider. ? Elevate your feet for 15 minutes, 3-4 times a day. ? Limit salt in your diet. Safety  Wear your seat belt at all times when driving or riding in a car.  Talk with your health care provider if someone is verbally or physically abusive to you. Lifestyle  Do not use hot tubs, steam rooms, or saunas.  Do not douche. Do not use tampons or scented sanitary pads.  Avoid cat litter boxes and soil used by cats. These carry germs that can cause birth defects in the baby and possibly loss of the fetus by miscarriage or stillbirth.  Do not use herbal remedies, alcohol, illegal drugs, or medicines that are not approved by your health care provider. Chemicals in these products can harm your baby.  Do not use any products that contain nicotine or tobacco, such as cigarettes, e-cigarettes, and chewing tobacco. If you need help quitting, ask your health care provider. General instructions  During a routine prenatal visit, your health care provider will do a physical exam and other tests. He or she will also discuss your overall health. Keep all follow-up visits. This is important.  Ask your health care provider for a referral to a local prenatal education class.  Ask for help if you have counseling or nutritional needs during pregnancy. Your health care provider can offer advice or refer you to specialists for help with various needs. Where to find more information  American Pregnancy Association: americanpregnancy.org  [employer]ians and Gynecologists: https://www.todd-brady.net/  Office on [employer] Health: [employer].nz Contact a health care provider if you have:  A headache that does not go away when you take medicine.  Vision changes or you see spots in front of your eyes.  Mild pelvic cramps, pelvic pressure, or nagging pain in the abdominal area.  Persistent nausea, vomiting, or diarrhea.  A bad-smelling vaginal discharge or  foul-smelling urine.  Pain when you urinate.  Sudden or extreme swelling of your face, hands, ankles, feet, or legs.  A fever. Get help right away if you:  Have fluid leaking from your vagina.  Have spotting or bleeding from your vagina.  Have severe abdominal cramping or pain.  Have difficulty breathing.  Have chest pain.  Have fainting spells.  Have not felt your baby move for the time period told by your health care provider.  Have new or increased pain, swelling, or redness in an arm or leg. Summary  The second trimester of pregnancy is from week 13 through week 27 (months 4 through 6).  Do not use herbal remedies, alcohol, illegal drugs, or medicines that are not approved by your health care provider. Chemicals in these products can harm your baby.  Exercise only as directed by your health care provider. Most people can continue their usual exercise routine during pregnancy.  Keep all follow-up visits. This is important. This information is not intended to replace advice given to you by your health care provider. Make sure you discuss any questions you have with your health care provider. Document Revised: 03/31/2020 Document Reviewed: 02/05/2020 Elsevier Patient Education  2021 [employer].

## 2021-03-31 LAB — CERVICOVAGINAL ANCILLARY ONLY
Chlamydia: NEGATIVE
Comment: NEGATIVE
Comment: NEGATIVE
Comment: NORMAL
Neisseria Gonorrhea: NEGATIVE
Trichomonas: NEGATIVE

## 2021-04-01 LAB — URINE CULTURE, OB REFLEX

## 2021-04-01 LAB — CULTURE, OB URINE

## 2021-04-06 ENCOUNTER — Ambulatory Visit: Payer: Medicare Other

## 2021-04-06 ENCOUNTER — Encounter: Payer: Self-pay | Admitting: Women's Health

## 2021-04-06 DIAGNOSIS — O361910 Maternal care for other isoimmunization, first trimester, not applicable or unspecified: Secondary | ICD-10-CM | POA: Insufficient documentation

## 2021-04-06 LAB — OBSTETRIC PANEL, INCLUDING HIV
Basophils Absolute: 0 x10E3/uL (ref 0.0–0.2)
Basos: 0 %
EOS (ABSOLUTE): 0.1 x10E3/uL (ref 0.0–0.4)
Eos: 1 %
HIV Screen 4th Generation wRfx: NONREACTIVE
Hematocrit: 40.2 % (ref 34.0–46.6)
Hemoglobin: 13.7 g/dL (ref 11.1–15.9)
Hepatitis B Surface Ag: NEGATIVE
Immature Grans (Abs): 0 x10E3/uL (ref 0.0–0.1)
Immature Granulocytes: 0 %
Lymphocytes Absolute: 1.6 x10E3/uL (ref 0.7–3.1)
Lymphs: 31 %
MCH: 30.9 pg (ref 26.6–33.0)
MCHC: 34.1 g/dL (ref 31.5–35.7)
MCV: 91 fL (ref 79–97)
Monocytes Absolute: 0.5 x10E3/uL (ref 0.1–0.9)
Monocytes: 10 %
Neutrophils Absolute: 2.9 x10E3/uL (ref 1.4–7.0)
Neutrophils: 58 %
Platelets: 306 x10E3/uL (ref 150–450)
RBC: 4.44 x10E6/uL (ref 3.77–5.28)
RDW: 12.5 % (ref 11.7–15.4)
RPR Ser Ql: NONREACTIVE
Rh Factor: POSITIVE
Rubella Antibodies, IGG: 1 {index} (ref >0.99)
WBC: 5.1 x10E3/uL (ref 3.4–10.8)

## 2021-04-06 LAB — COMPREHENSIVE METABOLIC PANEL WITH GFR
ALT: 16 IU/L (ref 0–32)
AST: 12 IU/L (ref 0–40)
Albumin/Globulin Ratio: 1.5 (ref 1.2–2.2)
Albumin: 4.3 g/dL (ref 3.9–5.0)
Alkaline Phosphatase: 45 IU/L (ref 42–106)
BUN/Creatinine Ratio: 20 (ref 9–23)
BUN: 10 mg/dL (ref 6–20)
Bilirubin Total: 0.2 mg/dL (ref 0.0–1.2)
CO2: 19 mmol/L — ABNORMAL LOW (ref 20–29)
Calcium: 9.7 mg/dL (ref 8.7–10.2)
Chloride: 101 mmol/L (ref 96–106)
Creatinine, Ser: 0.51 mg/dL — ABNORMAL LOW (ref 0.57–1.00)
Globulin, Total: 2.9 g/dL (ref 1.5–4.5)
Glucose: 81 mg/dL (ref 65–99)
Potassium: 4.7 mmol/L (ref 3.5–5.2)
Sodium: 134 mmol/L (ref 134–144)
Total Protein: 7.2 g/dL (ref 6.0–8.5)
eGFR: 137 mL/min/1.73 (ref >59)

## 2021-04-06 LAB — QUANTIFERON-TB GOLD PLUS
QuantiFERON Mitogen Value: >10 IU/mL
QuantiFERON Nil Value: 0.05 IU/mL
QuantiFERON TB1 Ag Value: 0.04 IU/mL
QuantiFERON TB2 Ag Value: 0.04 IU/mL
QuantiFERON-TB Gold Plus: NEGATIVE

## 2021-04-06 LAB — PROTEIN / CREATININE RATIO, URINE
Creatinine, Urine: 279.9 mg/dL
Protein, Ur: 60.8 mg/dL
Protein/Creat Ratio: 217 mg/g{creat} — ABNORMAL HIGH (ref 0–200)

## 2021-04-06 LAB — AB SCR+ANTIBODY ID: Antibody Screen: POSITIVE — AB

## 2021-04-06 LAB — HEPATITIS C ANTIBODY: Hep C Virus Ab: <0.1 {s_co_ratio} (ref 0.0–0.9)

## 2021-04-07 ENCOUNTER — Encounter: Payer: Self-pay | Admitting: Women's Health

## 2021-04-13 ENCOUNTER — Telehealth: Payer: Self-pay

## 2021-04-13 NOTE — Telephone Encounter (Signed)
Called pt to advise to call pharmacy with updated medicaid info so that rx can be covered. No answer, vm is not set up.

## 2021-04-15 ENCOUNTER — Other Ambulatory Visit: Payer: Self-pay

## 2021-04-19 ENCOUNTER — Encounter: Payer: Medicare Other | Admitting: Licensed Clinical Social Worker

## 2021-04-27 ENCOUNTER — Encounter: Payer: Self-pay | Admitting: Obstetrics and Gynecology

## 2021-04-27 ENCOUNTER — Ambulatory Visit (INDEPENDENT_AMBULATORY_CARE_PROVIDER_SITE_OTHER): Payer: Medicare Other | Admitting: Obstetrics and Gynecology

## 2021-04-27 ENCOUNTER — Other Ambulatory Visit: Payer: Self-pay

## 2021-04-27 VITALS — BP 133/82 | HR 69 | Wt 115.0 lb

## 2021-04-27 DIAGNOSIS — Z3A14 14 weeks gestation of pregnancy: Secondary | ICD-10-CM

## 2021-04-27 DIAGNOSIS — O10919 Unspecified pre-existing hypertension complicating pregnancy, unspecified trimester: Secondary | ICD-10-CM

## 2021-04-27 DIAGNOSIS — O361910 Maternal care for other isoimmunization, first trimester, not applicable or unspecified: Secondary | ICD-10-CM

## 2021-04-27 DIAGNOSIS — Z3401 Encounter for supervision of normal first pregnancy, first trimester: Secondary | ICD-10-CM

## 2021-04-27 DIAGNOSIS — Z227 Latent tuberculosis: Secondary | ICD-10-CM

## 2021-04-27 DIAGNOSIS — F331 Major depressive disorder, recurrent, moderate: Secondary | ICD-10-CM

## 2021-04-27 MED ORDER — ONDANSETRON 4 MG PO TBDP: 4.0000 mg | ORAL_TABLET | Freq: Four times a day (QID) | ORAL | 0 refills | Status: DC | PRN

## 2021-04-27 MED ORDER — ASPIRIN EC 81 MG PO TBEC: 81.0000 mg | DELAYED_RELEASE_TABLET | Freq: Every day | ORAL | 7 refills | Status: DC

## 2021-04-27 NOTE — Progress Notes (Signed)
   PRENATAL VISIT NOTE  Subjective:  Betty Logan is a 20 y.o. G1P0000 at [redacted]w[redacted]d being seen today for ongoing prenatal care.  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  Patient reports nausea.  Contractions: Not present. Vag. Bleeding: None.   . Denies leaking of fluid.   The following portions of the patient's history were reviewed and updated as appropriate: allergies, current medications, past family history, past medical history, past social history, past surgical history and problem list.   Objective:   Vitals:   04/27/21 1057  BP: 133/82  Pulse: 69  Weight: 115 lb (52.2 kg)   Fetal Status: Fetal Heart Rate (bpm): 154         General:  Alert, oriented and cooperative. Patient is in no acute distress.  Skin: Skin is warm and dry. No rash noted.   Cardiovascular: Normal heart rate noted  Respiratory: Normal respiratory effort, no problems with respiration noted  Abdomen: Soft, gravid, appropriate for gestational age.  Pain/Pressure: Absent     Pelvic: Cervical exam deferred        Extremities: Normal range of motion.  Edema: None  Mental Status: Normal mood and affect. Normal behavior. Normal judgment and thought content.   Assessment and Plan:  Pregnancy: G1P0000 at [redacted]w[redacted]d  1. Chronic hypertension affecting pregnancy - Was on meds in past, she stopped taking it because she could not get any more refills, stopped seeing doctor - start baby aspirin  2. MDD (major depressive disorder), recurrent episode, moderate (HCC) -  doesn't want to be around anybody, doesn't think it is affecting relationships - missed appt with Sue Lush but wants to reschedule - denies SI/HI  3. Latent tuberculosis Neg quant gold  4. Encounter for supervision of normal first pregnancy in first trimester  5. 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 [redacted] weeks gestation of pregnancy   Preterm labor symptoms and general obstetric precautions including but not limited to vaginal bleeding, contractions, leaking of fluid and fetal movement were reviewed in detail with the patient. Please refer to After Visit Summary for other counseling recommendations.   Return in about 4 weeks (around 05/25/2021) for high OB, in person.  Future Appointments  Date Time Provider Department Center  05/30/2021 12:45 PM [facility] NURSE [facility]  05/30/2021  1:00 PM [facility]    Conan Bowens, MD

## 2021-04-27 NOTE — Progress Notes (Signed)
Pt c/o increased back pain. Denies any problems with urination. States she has a hard time getting out of bed in the morning. Pt states she needs medication for N/V. Diclegis is not covered.

## 2021-05-03 ENCOUNTER — Ambulatory Visit (INDEPENDENT_AMBULATORY_CARE_PROVIDER_SITE_OTHER): Payer: Medicare Other | Admitting: Licensed Clinical Social Worker

## 2021-05-03 DIAGNOSIS — O99340 Other mental disorders complicating pregnancy, unspecified trimester: Secondary | ICD-10-CM

## 2021-05-03 DIAGNOSIS — F32A Depression, unspecified: Secondary | ICD-10-CM

## 2021-05-03 DIAGNOSIS — Z3A00 Weeks of gestation of pregnancy not specified: Secondary | ICD-10-CM

## 2021-05-06 NOTE — BH Specialist Note (Signed)
Integrated Behavioral Health via Telemedicine Visit  05/06/2021 JANESSA MICKLE [id number]  Number of Integrated Behavioral Health visits: 1/6 Session Start time: 2:45pm  Session End time: 3:13pm Total time: 28 mins via mychart  Referring Provider: Farris Has NP Patient/Family location: Home  [facility] Provider location: [facility]  All persons participating in visit: PT S. Wray and LCSWA A. Felton Clinton Types of Service: General Behavioral Integrated Care (BHI)  I connected with Bayard Hugger and/or Selina J Moxon's n/a via  Telephone or Engineer, civil (consulting)  (Video is Surveyor, mining) and verified that I am speaking with the correct person using two identifiers. Discussed confidentiality: Yes   I discussed the limitations of telemedicine and the availability of in person appointments.  Discussed there is a possibility of technology failure and discussed alternative modes of communication if that failure occurs.  I discussed that engaging in this telemedicine visit, they consent to the provision of behavioral healthcare and the services will be billed under their insurance.  Patient and/or legal guardian expressed understanding and consented to Telemedicine visit: Yes   Presenting Concerns: Patient and/or family reports the following symptoms/concerns: history of depression  Duration of problem: over one year ; Severity of problem: mild  Patient and/or Family's Strengths/Protective Factors: Concrete supports in place (healthy food, safe environments, etc.)  Goals Addressed: Patient will:  Reduce symptoms of: depression   Increase knowledge and/or ability of: coping skills   Demonstrate ability to: Increase adequate support systems for patient/family  Progress towards Goals: Ongoing  Interventions: Interventions utilized:  Supportive Counseling Standardized Assessments completed: PHQ 9  Patient and/or Family Response: Ms. Racanelli reports history of  depression. Ms. Glaza reports sister and brother in law are supportive.   Assessment: Patient currently experiencing history of depression .   Patient may benefit from integrated behavioral health .  Plan: Follow up with behavioral health clinician on : 3 weeks via mychart  Behavioral recommendations: prioritize rest, take medication as directed, schedule self care to alleviate stress  Referral(s): Integrated [employer] (In Clinic)  I discussed the assessment and treatment plan with the patient and/or parent/guardian. They were provided an opportunity to ask questions and all were answered. They agreed with the plan and demonstrated an understanding of the instructions.   They were advised to call back or seek an in-person evaluation if the symptoms worsen or if the condition fails to improve as anticipated.  Gwyndolyn Saxon, LCSW

## 2021-05-25 ENCOUNTER — Encounter: Payer: Medicare Other | Admitting: Family Medicine

## 2021-05-30 ENCOUNTER — Other Ambulatory Visit: Payer: Self-pay | Admitting: *Deleted

## 2021-05-30 ENCOUNTER — Ambulatory Visit: Payer: Medicare Other | Attending: Women's Health

## 2021-05-30 ENCOUNTER — Encounter: Payer: Self-pay | Admitting: *Deleted

## 2021-05-30 ENCOUNTER — Other Ambulatory Visit: Payer: Self-pay

## 2021-05-30 ENCOUNTER — Ambulatory Visit: Payer: Medicare Other | Admitting: *Deleted

## 2021-05-30 VITALS — BP 124/73 | HR 83

## 2021-05-30 DIAGNOSIS — Z363 Encounter for antenatal screening for malformations: Secondary | ICD-10-CM

## 2021-05-30 DIAGNOSIS — R030 Elevated blood-pressure reading, without diagnosis of hypertension: Secondary | ICD-10-CM | POA: Insufficient documentation

## 2021-05-30 DIAGNOSIS — O361910 Maternal care for other isoimmunization, first trimester, not applicable or unspecified: Secondary | ICD-10-CM

## 2021-05-30 DIAGNOSIS — Z3401 Encounter for supervision of normal first pregnancy, first trimester: Secondary | ICD-10-CM

## 2021-05-30 DIAGNOSIS — O10012 Pre-existing essential hypertension complicating pregnancy, second trimester: Secondary | ICD-10-CM

## 2021-05-30 DIAGNOSIS — O10919 Unspecified pre-existing hypertension complicating pregnancy, unspecified trimester: Secondary | ICD-10-CM | POA: Diagnosis present

## 2021-05-30 DIAGNOSIS — Z362 Encounter for other antenatal screening follow-up: Secondary | ICD-10-CM

## 2021-05-30 DIAGNOSIS — O321XX0 Maternal care for breech presentation, not applicable or unspecified: Secondary | ICD-10-CM | POA: Diagnosis not present

## 2021-05-30 DIAGNOSIS — Z3A18 18 weeks gestation of pregnancy: Secondary | ICD-10-CM | POA: Diagnosis not present

## 2021-06-08 ENCOUNTER — Other Ambulatory Visit: Payer: Self-pay

## 2021-06-08 ENCOUNTER — Other Ambulatory Visit: Payer: Self-pay | Admitting: Obstetrics and Gynecology

## 2021-06-08 DIAGNOSIS — O219 Vomiting of pregnancy, unspecified: Secondary | ICD-10-CM

## 2021-06-10 ENCOUNTER — Other Ambulatory Visit: Payer: Self-pay

## 2021-06-10 MED ORDER — ONDANSETRON 4 MG PO TBDP: 4.0000 mg | ORAL_TABLET | Freq: Three times a day (TID) | ORAL | 0 refills | Status: DC | PRN

## 2021-06-17 ENCOUNTER — Other Ambulatory Visit: Payer: Self-pay

## 2021-06-17 ENCOUNTER — Inpatient Hospital Stay (HOSPITAL_COMMUNITY)
Admission: EM | Admit: 2021-06-17 | Discharge: 2021-06-17 | Disposition: A | Discharge status: Home / Self Care | Payer: Medicare Other | Attending: Obstetrics & Gynecology | Admitting: Obstetrics & Gynecology

## 2021-06-17 DIAGNOSIS — Z3A21 21 weeks gestation of pregnancy: Secondary | ICD-10-CM

## 2021-06-17 DIAGNOSIS — O9A219 Injury, poisoning and certain other consequences of external causes complicating pregnancy, unspecified trimester: Secondary | ICD-10-CM

## 2021-06-17 DIAGNOSIS — O361910 Maternal care for other isoimmunization, first trimester, not applicable or unspecified: Secondary | ICD-10-CM

## 2021-06-17 DIAGNOSIS — O99891 Other specified diseases and conditions complicating pregnancy: Secondary | ICD-10-CM | POA: Diagnosis not present

## 2021-06-17 DIAGNOSIS — W501XXA Accidental kick by another person, initial encounter: Secondary | ICD-10-CM | POA: Insufficient documentation

## 2021-06-17 DIAGNOSIS — R109 Unspecified abdominal pain: Secondary | ICD-10-CM | POA: Insufficient documentation

## 2021-06-17 DIAGNOSIS — O9A212 Injury, poisoning and certain other consequences of external causes complicating pregnancy, second trimester: Secondary | ICD-10-CM | POA: Diagnosis not present

## 2021-06-17 DIAGNOSIS — O10919 Unspecified pre-existing hypertension complicating pregnancy, unspecified trimester: Secondary | ICD-10-CM

## 2021-06-17 DIAGNOSIS — Z3401 Encounter for supervision of normal first pregnancy, first trimester: Secondary | ICD-10-CM

## 2021-06-17 DIAGNOSIS — Z7982 Long term (current) use of aspirin: Secondary | ICD-10-CM | POA: Diagnosis not present

## 2021-06-17 DIAGNOSIS — R1030 Lower abdominal pain, unspecified: Secondary | ICD-10-CM | POA: Diagnosis not present
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
## 2021-06-27 ENCOUNTER — Ambulatory Visit: Payer: Medicare Other | Admitting: *Deleted

## 2021-06-27 ENCOUNTER — Other Ambulatory Visit: Payer: Self-pay | Admitting: *Deleted

## 2021-06-27 ENCOUNTER — Encounter: Payer: Medicare Other | Admitting: Obstetrics and Gynecology

## 2021-06-27 ENCOUNTER — Other Ambulatory Visit: Payer: Self-pay

## 2021-06-27 ENCOUNTER — Encounter: Payer: Self-pay | Admitting: *Deleted

## 2021-06-27 ENCOUNTER — Ambulatory Visit: Payer: Medicare Other | Attending: Obstetrics

## 2021-06-27 VITALS — BP 117/78 | HR 82

## 2021-06-27 DIAGNOSIS — Z362 Encounter for other antenatal screening follow-up: Secondary | ICD-10-CM | POA: Diagnosis present

## 2021-06-27 DIAGNOSIS — Z3401 Encounter for supervision of normal first pregnancy, first trimester: Secondary | ICD-10-CM | POA: Diagnosis present

## 2021-06-27 DIAGNOSIS — O10919 Unspecified pre-existing hypertension complicating pregnancy, unspecified trimester: Secondary | ICD-10-CM

## 2021-06-27 DIAGNOSIS — O361910 Maternal care for other isoimmunization, first trimester, not applicable or unspecified: Secondary | ICD-10-CM

## 2021-07-08 ENCOUNTER — Ambulatory Visit (INDEPENDENT_AMBULATORY_CARE_PROVIDER_SITE_OTHER): Payer: Medicare Other | Admitting: Family Medicine

## 2021-07-08 ENCOUNTER — Other Ambulatory Visit: Payer: Self-pay

## 2021-07-08 ENCOUNTER — Encounter: Payer: Self-pay | Admitting: Family Medicine

## 2021-07-08 VITALS — BP 128/85 | HR 71 | Wt 136.0 lb

## 2021-07-08 DIAGNOSIS — Z23 Encounter for immunization: Secondary | ICD-10-CM

## 2021-07-08 DIAGNOSIS — O361910 Maternal care for other isoimmunization, first trimester, not applicable or unspecified: Secondary | ICD-10-CM

## 2021-07-08 DIAGNOSIS — Z3A25 25 weeks gestation of pregnancy: Secondary | ICD-10-CM

## 2021-07-08 DIAGNOSIS — O10919 Unspecified pre-existing hypertension complicating pregnancy, unspecified trimester: Secondary | ICD-10-CM

## 2021-07-08 DIAGNOSIS — Z3401 Encounter for supervision of normal first pregnancy, first trimester: Secondary | ICD-10-CM

## 2021-07-08 DIAGNOSIS — Z227 Latent tuberculosis: Secondary | ICD-10-CM

## 2021-07-08 NOTE — Progress Notes (Signed)
   PRENATAL VISIT NOTE  Subjective:  Betty Logan is a 20 y.o. G1P0000 at [redacted]w[redacted]d being seen today for ongoing prenatal care.  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  Patient reports occasional headaches and nausea since last visit. She has good relief with Tylenol for her headaches but wanted to make sure it is safe to take in pregnancy. Her nausea is controlled with anti-nausea medicine PRN. She does note that sometimes she goes a long time without eating, which may be making her symptoms worse. She is taking her ASA 81 mg and prenatal vitamins daily. Contractions: Not present. Vag. Bleeding: None.  Movement: Present. Denies leaking of fluid.   The following portions of the patient's history were reviewed and updated as appropriate: allergies, current medications, past family history, past medical history, past social history, past surgical history and problem list.   Objective:   Vitals:   07/08/21 1111  BP: 128/85  Pulse: 71  Weight: 136 lb (61.7 kg)    Fetal Status: Fetal Heart Rate (bpm): 140   Movement: Present     General:  Alert, oriented and cooperative. Patient is in no acute distress.  Skin: Skin is warm and dry. No rash noted.   Cardiovascular: Normal heart rate noted.  Respiratory: Normal respiratory effort, no problems with respiration noted.  Abdomen: Soft, gravid, appropriate for gestational age.  Pain/Pressure: Absent     Pelvic: Cervical exam deferred.        Extremities: Normal range of motion.  Edema: None   Mental Status: Normal mood and affect. Normal behavior. Normal judgment and thought content.   Assessment and Plan:  Pregnancy: G1P0000 at [redacted]w[redacted]d  1. Encounter for supervision of normal first pregnancy in first trimester 2. [redacted] weeks gestation of pregnancy Progressing well. Occasional headaches and nausea controlled with Tylenol and anti-nausea medications PRN. Discussed that Tylenol is safe to take in pregnancy. Encouraged patient to eat meals regularly and drink plenty of fluids to help prevent symptoms. FHT within normal range. Plan to follow up for next prenatal visit in 4 weeks.  3. Need for influenza vaccination Administered and tolerated without complication. - Flu Vaccine QUAD 36+ mos IM (Fluarix, Quad PF)  4. Chronic hypertension affecting pregnancy BP remains within normal limits. Taking ASA 81 mg daily. Normal interval growth last [place] on 8/22. Will continue to monitor.   5. 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  6. Latent tuberculosis Negative QuantiFERON gold 03/30/21.  Preterm labor symptoms and general obstetric precautions including but not limited to vaginal bleeding, contractions, leaking of fluid and fetal movement were reviewed in detail with the patient.  Please refer to After Visit Summary for other counseling recommendations.   Return in 4 weeks (on 08/05/2021) for follow up high risk OB visit.  Future Appointments  Date Time Provider Department Center  07/25/2021  1:30 PM [facility] NURSE [facility]  07/25/2021  1:45 PM [facility] US4 [facility]  08/04/2021  1:30 PM Constant, Gigi Gin, MD [facility] None    Worthy Rancher, MD

## 2021-07-08 NOTE — Progress Notes (Signed)
Pt presents for ROB c/o headaches.  Pt also requests to start BP pills - BP wnl today. Flu vaccine given R Del w/o difficulty.

## 2021-07-08 NOTE — Patient Instructions (Signed)
Preterm Labor The normal length of a pregnancy is 39-41 weeks. Preterm labor is when labor starts before 37 completed weeks of pregnancy. Babies who are born prematurely and survive may not be fully developed and may be at an increased risk for long-term problems such as cerebral palsy, developmental delays, and vision andhearing problems. Babies who are born too early may have problems soon after birth. Premature babies may have problems regulating blood sugar, body temperature, heart rate, and breathing rate. These babies often have trouble with feeding. The risk ofhaving problems is highest for babies who are born before 34 weeks of pregnancy. What are the causes? The exact cause of this condition is not known. What increases the risk? You are more likely to have preterm labor if you have certain risk factors that relate to your medical history, problems with present and past pregnancies, andlifestyle factors. Medical history You have abnormalities of the uterus, including a short cervix. You have STIs (sexually transmitted infections) or other infections of the urinary tract and the vagina. You have chronic illnesses, such as blood clotting problems, diabetes, or high blood pressure. You are overweight or underweight. Present and past pregnancies You have had preterm labor before. You are pregnant with twins or other multiples. You have been diagnosed with a condition in which the placenta covers your cervix (placenta previa). You waited less than 18 months between giving birth and becoming pregnant again. Your unborn baby has some abnormalities. You have vaginal bleeding during pregnancy. You became pregnant through in vitro fertilization (IVF). Lifestyle and environmental factors You use tobacco products or drink alcohol. You use drugs. You have stress and no social support. You experience domestic violence. You are exposed to certain chemicals or environmental pollutants. Other  factors You are younger than age 17 or older than age 35. What are the signs or symptoms? Symptoms of this condition include: Cramps similar to those that can happen during a menstrual period. The cramps may happen with diarrhea. Pain in the abdomen or lower back. Regular contractions that may feel like tightening of the abdomen. A feeling of increased pressure in the pelvis. Increased watery or bloody mucus discharge from the vagina. Water breaking (ruptured amniotic sac). How is this diagnosed? This condition is diagnosed based on: Your medical history and a physical exam. A pelvic exam. An ultrasound. Monitoring your uterus for contractions. Other tests, including: A swab of the cervix to check for a chemical called fetal fibronectin. Urine tests. How is this treated? Treatment for this condition depends on the length of your pregnancy, your condition, and the health of your baby. Treatment may include: Taking medicines, such as: Hormone medicines. These may be given early in pregnancy to help support the pregnancy. Medicines to stop contractions. Medicines to help mature the baby's lungs. These may be prescribed if the risk of delivery is high. Medicines to help protect your baby from brain and nerve complications such as cerebral palsy. Bed rest. If the labor happens before 34 weeks of pregnancy, you may need to stay in the hospital. Delivery of the baby. Follow these instructions at home:  Do not use any products that contain nicotine or tobacco. These products include cigarettes, chewing tobacco, and vaping devices, such as e-cigarettes. If you need help quitting, ask your health care provider. Do not drink alcohol. Take over-the-counter and prescription medicines only as told by your health care provider. Rest as told by your health care provider. Return to your normal activities as told by your   health care provider. Ask your health care provider what activities are safe for  you. Keep all follow-up visits. This is important. How is this prevented? To increase your chance of having a full-term pregnancy: Do not use drugs or take medicines that have not been prescribed to you during your pregnancy. Talk with your health care provider before taking any herbal supplements, even if you have been taking them regularly. Make sure you gain a healthy amount of weight during your pregnancy. Watch for infection. If you think that you might have an infection, get it checked right away. Symptoms of infection may include: Fever. Abnormal vaginal discharge or discharge that smells bad. Pain or burning with urination. Needing to urinate urgently. Frequently urinating or passing small amounts of urine frequently. Blood in your urine or urine that smells bad or unusual. Where to find more information U.S. Department of Health and Human Services Office on Women's Health: www.womenshealth.gov The American College of Obstetricians and Gynecologists: www.acog.org Centers for Disease Control and Prevention, Preterm Birth: www.cdc.gov Contact a health care provider if: You think you are going into preterm labor. You have signs or symptoms of preterm labor. You have symptoms of infection. Get help right away if: You are having regular, painful contractions every 5 minutes or less. Your water breaks. Summary Preterm labor is labor that starts before you reach 37 weeks of pregnancy. Delivering your baby early increases your baby's risk of developing long-term problems. You are more likely to have preterm labor if you have certain risk factors that relate to your medical history, problems with present and past pregnancies, and lifestyle factors. Keep all follow-up visits. This is important. Contact a health care provider if you have signs or symptoms of preterm labor. This information is not intended to replace advice given to you by your health care provider. Make sure you discuss  any questions you have with your healthcare provider. Document Revised: 10/26/2020 Document Reviewed: 10/26/2020 Elsevier Patient Education  2022 Elsevier Inc.  

## 2021-07-11 ENCOUNTER — Encounter: Payer: Self-pay | Admitting: Family Medicine

## 2021-07-19 ENCOUNTER — Ambulatory Visit (INDEPENDENT_AMBULATORY_CARE_PROVIDER_SITE_OTHER): Payer: Medicare Other | Admitting: Nurse Practitioner

## 2021-07-19 ENCOUNTER — Encounter: Payer: Self-pay | Admitting: Nurse Practitioner

## 2021-07-19 ENCOUNTER — Other Ambulatory Visit: Payer: Self-pay

## 2021-07-19 VITALS — BP 125/84 | HR 85 | Wt 132.0 lb

## 2021-07-19 DIAGNOSIS — Z3400 Encounter for supervision of normal first pregnancy, unspecified trimester: Secondary | ICD-10-CM

## 2021-07-19 DIAGNOSIS — O219 Vomiting of pregnancy, unspecified: Secondary | ICD-10-CM

## 2021-07-19 DIAGNOSIS — Z3A26 26 weeks gestation of pregnancy: Secondary | ICD-10-CM

## 2021-07-19 MED ORDER — DOXYLAMINE-PYRIDOXINE 10-10 MG PO TBEC: DELAYED_RELEASE_TABLET | ORAL | 2 refills | Status: DC

## 2021-07-19 NOTE — Patient Instructions (Signed)
[email or web address] for childbirth and breastfeeding classes °

## 2021-07-19 NOTE — Progress Notes (Signed)
    Subjective:  Betty Logan is a 20 y.o. G1P0000 at [redacted]w[redacted]d being seen today for ongoing prenatal care.  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  Patient reports backache.  Contractions: Not present. Vag. Bleeding: None.  Movement: Present. Denies leaking of fluid.   Coming today for a problem visit.  Has been to MAU on 07-14-21 and note reviewed.  The following portions of the patient's history were reviewed and updated as appropriate: allergies, current medications, past family history, past medical history, past social history, past surgical history and problem list. Problem list updated.  Objective:   Vitals:   07/19/21 1356  BP: 125/84  Pulse: 85  Weight: 132 lb (59.9 kg)    Fetal Status:     Movement: Present     General:  Alert, oriented and cooperative. Patient is in no acute distress.  Skin: Skin is warm and dry. No rash noted.   Cardiovascular: Normal heart rate noted  Respiratory: Normal respiratory effort, no problems with respiration noted  Abdomen: Soft, gravid, appropriate for gestational age. Pain/Pressure: Present     Pelvic:  Cervical exam deferred        Extremities: Normal range of motion.     Mental Status: Normal mood and affect. Normal behavior. Normal judgment and thought content.   Urinalysis:      Assessment and Plan:  Pregnancy: G1P0000 at [redacted]w[redacted]d  1. Encounter for supervision of normal first pregnancy  Concerned as she is not sleeping the night through - advised this is normal in late pregnancy  and she was relieved to know that she is not having a problem but is experiencing normal things as her pregnancy is progressing. Advised to sign up for childbirth and breastfeeding classes Demonstrated exercises to use for back pain in pregnancy.  Pleased to know things she can do to feel better.  2. Nausea and vomiting in pregnancy Diclegis seems to work the best for her. Prescribed again to her pharmacy and reviewed how to take the Diclegis  3. [redacted] weeks gestation of pregnancy   Preterm labor symptoms and general obstetric precautions including but not limited to vaginal bleeding, contractions, leaking of fluid and fetal movement were reviewed in detail with the patient. Please refer to After Visit Summary for other counseling recommendations.  Return for has appointment scheduled - will be fasting for 2 hr glucola.  Nolene Bernheim, RN, MSN, NP-BC Nurse Practitioner, [facility] for [employer], [facility] 07/19/2021 2:33 PM

## 2021-07-19 NOTE — Progress Notes (Signed)
Pt states she is having significant back pain, nausea-Rx not helping.

## 2021-07-25 ENCOUNTER — Ambulatory Visit: Payer: Medicare Other | Attending: Maternal & Fetal Medicine

## 2021-07-25 ENCOUNTER — Other Ambulatory Visit: Payer: Self-pay | Admitting: *Deleted

## 2021-07-25 ENCOUNTER — Ambulatory Visit: Payer: Medicare Other | Admitting: *Deleted

## 2021-07-25 ENCOUNTER — Encounter: Payer: Self-pay | Admitting: *Deleted

## 2021-07-25 ENCOUNTER — Other Ambulatory Visit: Payer: Self-pay

## 2021-07-25 VITALS — BP 117/77 | HR 79

## 2021-07-25 DIAGNOSIS — Z3A26 26 weeks gestation of pregnancy: Secondary | ICD-10-CM

## 2021-07-25 DIAGNOSIS — O10012 Pre-existing essential hypertension complicating pregnancy, second trimester: Secondary | ICD-10-CM | POA: Diagnosis not present

## 2021-07-25 DIAGNOSIS — O361910 Maternal care for other isoimmunization, first trimester, not applicable or unspecified: Secondary | ICD-10-CM | POA: Diagnosis present

## 2021-07-25 DIAGNOSIS — O10919 Unspecified pre-existing hypertension complicating pregnancy, unspecified trimester: Secondary | ICD-10-CM | POA: Insufficient documentation

## 2021-07-25 DIAGNOSIS — Z3401 Encounter for supervision of normal first pregnancy, first trimester: Secondary | ICD-10-CM | POA: Insufficient documentation

## 2021-07-25 DIAGNOSIS — Z362 Encounter for other antenatal screening follow-up: Secondary | ICD-10-CM

## 2021-07-28 ENCOUNTER — Ambulatory Visit (INDEPENDENT_AMBULATORY_CARE_PROVIDER_SITE_OTHER): Payer: Medicare Other | Admitting: Licensed Clinical Social Worker

## 2021-07-28 DIAGNOSIS — F331 Major depressive disorder, recurrent, moderate: Secondary | ICD-10-CM | POA: Diagnosis not present

## 2021-08-01 NOTE — BH Specialist Note (Signed)
Integrated Behavioral Health via Telemedicine Visit  08/01/2021 Betty Logan [id number]  Number of Integrated Behavioral Health visits: 2/6 Session Start time: 2:00pm  Session End time: 2:16pm Total time: 16 mins via mychart video  Referring Provider: Carloyn Jaeger CNM Patient/Family location: Home  [facility] Provider location: [facility]  All persons participating in visit: Pt S. Duris and LCSW A. Felton Clinton  Types of Service: General Behavioral Integrated Care (BHI)  I connected with Bayard Hugger and/or Tarrah J Hynes's n/a via  Telephone or Engineer, civil (consulting)  (Video is Surveyor, mining) and verified that I am speaking with the correct person using two identifiers. Discussed confidentiality: Yes   I discussed the limitations of telemedicine and the availability of in person appointments.  Discussed there is a possibility of technology failure and discussed alternative modes of communication if that failure occurs.  I discussed that engaging in this telemedicine visit, they consent to the provision of behavioral healthcare and the services will be billed under their insurance.  Patient and/or legal guardian expressed understanding and consented to Telemedicine visit: Yes   Presenting Concerns: Patient and/or family reports the following symptoms/concerns: mood disorder  Duration of problem: over one year ; Severity of problem: mild  Patient and/or Family's Strengths/Protective Factors: Concrete supports in place (healthy food, safe environments, etc.)  Goals Addressed: Patient will:  Reduce symptoms of: mood instability and stress   Increase knowledge and/or ability of: healthy habits and self-management skills   Demonstrate ability to: Increase healthy adjustment to current life circumstances and Increase adequate support systems for patient/family  Progress towards Goals: Ongoing  Interventions: Interventions utilized:  Supportive  Counseling Standardized Assessments completed: PHQ 9  Patient and/or Family Response: Betty Logan reports high level of stress due to family conflict and unstable housing  Assessment: Patient has history of depression. Betty Logan reports conflict with sister is causing increase stress.   Patient may benefit from integrated behavioral health.  Plan: Follow up with behavioral health clinician on : as needed Behavioral recommendations: prioritize rest, take medication as directed, schedule self care to alleviate stress  Referral(s): Integrated [employer] (In Clinic)  I discussed the assessment and treatment plan with the patient and/or parent/guardian. They were provided an opportunity to ask questions and all were answered. They agreed with the plan and demonstrated an understanding of the instructions.   They were advised to call back or seek an in-person evaluation if the symptoms worsen or if the condition fails to improve as anticipated.  Gwyndolyn Saxon, LCSW

## 2021-08-04 ENCOUNTER — Other Ambulatory Visit: Payer: Medicare Other

## 2021-08-04 ENCOUNTER — Encounter: Payer: Medicare Other | Admitting: Obstetrics and Gynecology

## 2021-08-04 ENCOUNTER — Encounter: Payer: Self-pay | Admitting: Medical

## 2021-08-04 ENCOUNTER — Ambulatory Visit (INDEPENDENT_AMBULATORY_CARE_PROVIDER_SITE_OTHER): Payer: Medicare Other | Admitting: Medical

## 2021-08-04 ENCOUNTER — Other Ambulatory Visit: Payer: Self-pay

## 2021-08-04 VITALS — BP 109/75 | HR 90 | Wt 135.0 lb

## 2021-08-04 DIAGNOSIS — Z3A28 28 weeks gestation of pregnancy: Secondary | ICD-10-CM

## 2021-08-04 DIAGNOSIS — Z227 Latent tuberculosis: Secondary | ICD-10-CM

## 2021-08-04 DIAGNOSIS — Z3400 Encounter for supervision of normal first pregnancy, unspecified trimester: Secondary | ICD-10-CM

## 2021-08-04 DIAGNOSIS — Z3401 Encounter for supervision of normal first pregnancy, first trimester: Secondary | ICD-10-CM

## 2021-08-04 DIAGNOSIS — O10919 Unspecified pre-existing hypertension complicating pregnancy, unspecified trimester: Secondary | ICD-10-CM

## 2021-08-04 NOTE — Progress Notes (Signed)
Notified by CMA at 1013 that patient had episode of emesis after glucola. Will attempt to repeat at next visit.   Vonzella Nipple, PA-C 08/04/2021 10:13 AM

## 2021-08-04 NOTE — Progress Notes (Signed)
   PRENATAL VISIT NOTE  Subjective:  Betty Logan is a 20 y.o. G1P0000 at [redacted]w[redacted]d being seen today for ongoing prenatal care.  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  Patient reports no complaints.  Contractions: Not present. Vag. Bleeding: None.  Movement: Present. Denies leaking of fluid.   The following portions of the patient's history were reviewed and updated as appropriate: allergies, current medications, past family history, past medical history, past social history, past surgical history and problem list.   Objective:   Vitals:   08/04/21 0915  BP: 109/75  Pulse: 90  Weight: 135 lb (61.2 kg)    Fetal Status: Fetal Heart Rate (bpm): 150 Fundal Height: 28 cm Movement: Present     General:  Alert, oriented and cooperative. Patient is in no acute distress.  Skin: Skin is warm and dry. No rash noted.   Cardiovascular: Normal heart rate noted  Respiratory: Normal respiratory effort, no problems with respiration noted  Abdomen: Soft, gravid, appropriate for gestational age.  Pain/Pressure: Absent     Pelvic: Cervical exam deferred        Extremities: Normal range of motion.  Edema: None  Mental Status: Normal mood and affect. Normal behavior. Normal judgment and thought content.   Assessment and Plan:  Pregnancy: G1P0000 at [redacted]w[redacted]d 1. Supervision of normal first pregnancy, antepartum - Glucose Tolerance, 2 Hours w/1 Hour - CBC - RPR - HIV Antibody (routine testing w rflx) - Declined TDAP and Flu today   2. Chronic  hypertension affecting pregnancy - Normotensive today, currently no anti-hypertensive medications  - BASA  - Denies HA, visual changes or LE edema  4. Latent tuberculosis  5. [redacted] weeks gestation of pregnancy  Preterm labor symptoms and general obstetric precautions including but not limited to vaginal bleeding, contractions, leaking of fluid and fetal movement were reviewed in detail with the patient. Please refer to After Visit Summary for other counseling recommendations.   Return in about 2 weeks (around 08/18/2021) for LOB, In-Person.  Future Appointments  Date Time Provider Department Center  08/04/2021  9:55 AM Marny Lowenstein, PA-C [facility] None  08/22/2021  2:30 PM [facility] NURSE [facility]  08/22/2021  2:45 PM [facility] US5 [facility]    Vonzella Nipple, PA-C

## 2021-08-04 NOTE — Progress Notes (Addendum)
ROB [redacted]w[redacted]d  2hr GTT today. T-DAP: Declined  Flu VaccineL Received 07/08/21 PHQ9= 8 GAD 7 =3   CC: Face itching x 1 wk now pt unsure what may be causing sx's.

## 2021-08-05 LAB — CBC
Hematocrit: 34.7 % (ref 34.0–46.6)
Hemoglobin: 12.3 g/dL (ref 11.1–15.9)
MCH: 32.5 pg (ref 26.6–33.0)
MCHC: 35.4 g/dL (ref 31.5–35.7)
MCV: 92 fL (ref 79–97)
Platelets: 259 10*3/uL (ref 150–450)
RBC: 3.78 x10E6/uL (ref 3.77–5.28)
RDW: 12.8 % (ref 11.7–15.4)
WBC: 6.8 10*3/uL (ref 3.4–10.8)

## 2021-08-05 LAB — HIV ANTIBODY (ROUTINE TESTING W REFLEX): HIV Screen 4th Generation wRfx: NONREACTIVE

## 2021-08-05 LAB — RPR: RPR Ser Ql: NONREACTIVE

## 2021-08-18 ENCOUNTER — Encounter: Payer: Self-pay | Admitting: Obstetrics

## 2021-08-18 ENCOUNTER — Ambulatory Visit (INDEPENDENT_AMBULATORY_CARE_PROVIDER_SITE_OTHER): Payer: Medicare Other | Admitting: Obstetrics

## 2021-08-18 ENCOUNTER — Other Ambulatory Visit: Payer: Self-pay

## 2021-08-18 VITALS — BP 120/75 | HR 87 | Wt 134.0 lb

## 2021-08-18 DIAGNOSIS — Z3400 Encounter for supervision of normal first pregnancy, unspecified trimester: Secondary | ICD-10-CM

## 2021-08-18 DIAGNOSIS — O09899 Supervision of other high risk pregnancies, unspecified trimester: Secondary | ICD-10-CM

## 2021-08-18 DIAGNOSIS — Z131 Encounter for screening for diabetes mellitus: Secondary | ICD-10-CM

## 2021-08-18 DIAGNOSIS — Z91199 Patient's noncompliance with other medical treatment and regimen due to unspecified reason: Secondary | ICD-10-CM

## 2021-08-18 NOTE — Progress Notes (Signed)
Pt presents for ROB w/o complaints today.  

## 2021-08-18 NOTE — Progress Notes (Signed)
Subjective:  Betty Logan is a 20 y.o. G1P0000 at [redacted]w[redacted]d being seen today for ongoing prenatal care.  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  Patient reports backache and heartburn.  Contractions: Not present. Vag. Bleeding: None.  Movement: Present. Denies leaking of fluid.   The following portions of the patient's history were reviewed and updated as appropriate: allergies, current medications, past family history, past medical history, past social history, past surgical history and problem list. Problem list updated.  Objective:   Vitals:   08/18/21 1439  BP: 120/75  Pulse: 87  Weight: 134 lb (60.8 kg)    Fetal Status: Fetal Heart Rate (bpm): 143   Movement: Present     General:  Alert, oriented and cooperative. Patient is in no acute distress.  Skin: Skin is warm and dry. No rash noted.   Cardiovascular: Normal heart rate noted  Respiratory: Normal respiratory effort, no problems with respiration noted  Abdomen: Soft, gravid, appropriate for gestational age. Pain/Pressure: Absent     Pelvic:  Cervical exam deferred        Extremities: Normal range of motion.  Edema: None  Mental Status: Normal mood and affect. Normal behavior. Normal judgment and thought content.   Urinalysis:      Assessment and Plan:  Pregnancy: G1P0000 at [redacted]w[redacted]d  1. Supervision of normal first pregnancy, antepartum  2. Screening for diabetes mellitus Rx: - Hemoglobin A1c  3. Noncompliant pregnant patient, antepartum - did not keep appointments  for diabetic screening    Preterm labor symptoms and general obstetric precautions including but not limited to vaginal bleeding, contractions, leaking of fluid and fetal movement were reviewed in detail with the patient. Please refer to After Visit Summary for other counseling recommendations.   Return in about 2 weeks (around 09/01/2021) for ROB.   Brock Bad, MD  08/18/21

## 2021-08-19 LAB — HEMOGLOBIN A1C
Est. average glucose Bld gHb Est-mCnc: 88 mg/dL
Hgb A1c MFr Bld: 4.7 % — ABNORMAL LOW (ref 4.8–5.6)

## 2021-08-22 ENCOUNTER — Encounter: Payer: Self-pay | Admitting: *Deleted

## 2021-08-22 ENCOUNTER — Ambulatory Visit: Payer: Medicare Other | Attending: Obstetrics and Gynecology

## 2021-08-22 ENCOUNTER — Other Ambulatory Visit: Payer: Self-pay | Admitting: *Deleted

## 2021-08-22 ENCOUNTER — Other Ambulatory Visit: Payer: Self-pay

## 2021-08-22 ENCOUNTER — Ambulatory Visit: Payer: Medicare Other | Admitting: *Deleted

## 2021-08-22 VITALS — BP 114/70 | HR 78

## 2021-08-22 DIAGNOSIS — O361910 Maternal care for other isoimmunization, first trimester, not applicable or unspecified: Secondary | ICD-10-CM

## 2021-08-22 DIAGNOSIS — Z3A30 30 weeks gestation of pregnancy: Secondary | ICD-10-CM

## 2021-08-22 DIAGNOSIS — Z3401 Encounter for supervision of normal first pregnancy, first trimester: Secondary | ICD-10-CM

## 2021-08-22 DIAGNOSIS — O10919 Unspecified pre-existing hypertension complicating pregnancy, unspecified trimester: Secondary | ICD-10-CM

## 2021-08-22 DIAGNOSIS — O10013 Pre-existing essential hypertension complicating pregnancy, third trimester: Secondary | ICD-10-CM | POA: Diagnosis not present

## 2021-09-01 ENCOUNTER — Ambulatory Visit (INDEPENDENT_AMBULATORY_CARE_PROVIDER_SITE_OTHER): Payer: Medicare Other | Admitting: Advanced Practice Midwife

## 2021-09-01 ENCOUNTER — Other Ambulatory Visit: Payer: Self-pay

## 2021-09-01 VITALS — BP 116/72 | HR 88 | Wt 136.0 lb

## 2021-09-01 DIAGNOSIS — O10919 Unspecified pre-existing hypertension complicating pregnancy, unspecified trimester: Secondary | ICD-10-CM

## 2021-09-01 DIAGNOSIS — O0993 Supervision of high risk pregnancy, unspecified, third trimester: Secondary | ICD-10-CM

## 2021-09-01 DIAGNOSIS — Z3A32 32 weeks gestation of pregnancy: Secondary | ICD-10-CM

## 2021-09-01 NOTE — Progress Notes (Signed)
   PRENATAL VISIT NOTE  Subjective:  Betty Logan is a 20 y.o. G1P0000 at [redacted]w[redacted]d being seen today for ongoing prenatal care.  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  Patient reports no complaints.  Contractions: Not present. Vag. Bleeding: None.  Movement: Present. Denies leaking of fluid.   The following portions of the patient's history were reviewed and updated as appropriate: allergies, current medications, past family history, past medical history, past social history, past surgical history and problem list.   Objective:   Vitals:   09/01/21 1527  BP: 116/72  Pulse: 88  Weight: 136 lb (61.7 kg)    Fetal Status: Fetal Heart Rate (bpm): 150   Movement: Present     General:  Alert, oriented and cooperative. Patient is in no acute distress.  Skin: Skin is warm and dry. No rash noted.   Cardiovascular: Normal heart rate noted  Respiratory: Normal respiratory effort, no problems with respiration noted  Abdomen: Soft, gravid, appropriate for gestational age.  Pain/Pressure: Absent     Pelvic: Cervical exam deferred        Extremities: Normal range of motion.     Mental Status: Normal mood and affect. Normal behavior. Normal judgment and thought content.   Assessment and Plan:  Pregnancy: G1P0000 at [redacted]w[redacted]d 1. Chronic hypertension affecting pregnancy --hx CHTN, BP elevated at NOB, normotensive since, no meds  2. Supervision of high risk pregnancy in third trimester --Anticipatory guidance about next visits/weeks of  pregnancy given. --Pt reports breech position on earlier [place], [place] and bedside  US today confirm vertex position --Pt has doula and plans low intervention birth. Wanted to use tub for hydrotherapy but discussed CHTN as contraindication to immersion in tub. Pt states understanding. Discussed other options for freedom of movement like shower, sitting on birth ball, etc.   --Next visit in 2 weeks  3. [redacted] weeks gestation of pregnancy   Preterm labor symptoms and general obstetric precautions including but not limited to vaginal bleeding, contractions, leaking of fluid and fetal movement were reviewed in detail with the patient. Please refer to After Visit Summary for other counseling recommendations.   Return in about 2 years (around 09/02/2023).  Future Appointments  Date Time Provider Department Center  09/15/2021  1:50 PM Gerrit Heck, CNM [facility] None  09/19/2021  3:15 PM [facility] NURSE [facility]  09/19/2021  3:30 PM [facility] US3 [facility]    Sharen Counter, CNM

## 2021-09-15 ENCOUNTER — Other Ambulatory Visit: Payer: Self-pay

## 2021-09-15 ENCOUNTER — Ambulatory Visit (INDEPENDENT_AMBULATORY_CARE_PROVIDER_SITE_OTHER): Payer: Medicare Other

## 2021-09-15 VITALS — BP 128/80 | HR 84 | Wt 144.2 lb

## 2021-09-15 DIAGNOSIS — O10919 Unspecified pre-existing hypertension complicating pregnancy, unspecified trimester: Secondary | ICD-10-CM

## 2021-09-15 DIAGNOSIS — Z3A34 34 weeks gestation of pregnancy: Secondary | ICD-10-CM

## 2021-09-15 DIAGNOSIS — O0993 Supervision of high risk pregnancy, unspecified, third trimester: Secondary | ICD-10-CM

## 2021-09-15 NOTE — Progress Notes (Signed)
Pt presents for ROB c/o low abdominal pain. 

## 2021-09-15 NOTE — Progress Notes (Signed)
   LOW-RISK PREGNANCY OFFICE VISIT  Patient name: Betty Logan MRN [id number]  Date of birth: 2001-04-28 Chief Complaint:   Routine Prenatal Visit  Subjective:   Betty Logan is a 20 y.o. 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  Patient presents today, alone, for ROB.  She states she has been having lower abdominal and vaginal pain since Saturday.  She states she has not tried any interventions and finds it is not worsened by any activity.  She states the pain is constant, but is unable to describe the pain. She rates the pain a 6/10.   Patient endorses fetal movement. Patient denies abdominal cramping or contractions.  However, she does report "period cramps" in her legs.  She states it lasted from the time of arrival until she stood up. Patient denies vaginal concerns including abnormal discharge, leaking of fluid, and bleeding.  Contractions: Irregular. Vag. Bleeding: None.  Movement: Present.  Reviewed past medical,surgical, social, obstetrical and family history as well as problem list, medications and allergies.  Objective   Vitals:   09/15/21 1357  BP: 128/80  Pulse: 84  Weight: 144 lb 3.2 oz (65.4 kg)  Body mass index is 23.27 kg/m.  Total Weight Gain:23 lb 3.2 oz (10.5 kg)         Physical Examination:   General appearance: Well appearing, and in no distress  Mental status: Alert, oriented to person, place, and time  Skin: Warm & dry  Cardiovascular: Normal heart rate  noted  Respiratory: Normal respiratory effort, no distress  Abdomen: Soft, gravid, nontender, AGA with Fundal Height: 34 cm  Pelvic: Cervical exam deferred           Extremities: Edema: None  Fetal Status: Fetal Heart Rate (bpm): 145  Movement: Present   No results found for this or any previous visit (from the past 24 hour(s)).  Assessment & Plan:  Low-risk pregnancy of a 20 y.o., G1P0000 at [redacted]w[redacted]d with an Estimated Date of Delivery: 10/24/21   1. Supervision of high risk pregnancy in third trimester -Anticipatory guidance for upcoming appts. -Patient to schedule next appt in 2 weeks for a in-person visit. -Educated on GBS bacteria including what it is, why we test, and how and when we treat if needed.  2. [redacted] weeks gestation of pregnancy -Reviewed complaints -Reassured that normal to have some lower abdominal and vaginal pain. -Discussed usage of maternity belt, heating pads, and tylenol. -Encouraged to monitor and report worsening of symptoms.    3. Chronic hypertension affecting pregnancy -BP normotensive      Meds: No orders of the defined types were placed in this encounter.  Labs/procedures today:  Lab Orders  No laboratory test(s) ordered today     Reviewed: Preterm labor symptoms and general obstetric precautions including but not limited to vaginal bleeding, contractions, leaking of fluid and fetal movement were reviewed in detail with the patient.  All questions were answered.  Follow-up: No follow-ups on file.  No orders of the defined types were placed in this encounter.  Cherre Robins MSN, CNM 09/15/2021

## 2021-09-19 ENCOUNTER — Encounter: Payer: Self-pay | Admitting: *Deleted

## 2021-09-19 ENCOUNTER — Ambulatory Visit: Payer: Medicare Other | Attending: Obstetrics and Gynecology

## 2021-09-19 ENCOUNTER — Other Ambulatory Visit: Payer: Self-pay

## 2021-09-19 ENCOUNTER — Ambulatory Visit: Payer: Medicare Other | Admitting: *Deleted

## 2021-09-19 VITALS — BP 114/68 | HR 100

## 2021-09-19 DIAGNOSIS — Z3A34 34 weeks gestation of pregnancy: Secondary | ICD-10-CM

## 2021-09-19 DIAGNOSIS — O10919 Unspecified pre-existing hypertension complicating pregnancy, unspecified trimester: Secondary | ICD-10-CM | POA: Diagnosis not present

## 2021-09-19 DIAGNOSIS — O361910 Maternal care for other isoimmunization, first trimester, not applicable or unspecified: Secondary | ICD-10-CM | POA: Diagnosis present

## 2021-09-19 DIAGNOSIS — O0990 Supervision of high risk pregnancy, unspecified, unspecified trimester: Secondary | ICD-10-CM | POA: Diagnosis present

## 2021-09-19 DIAGNOSIS — O10013 Pre-existing essential hypertension complicating pregnancy, third trimester: Secondary | ICD-10-CM

## 2021-09-27 ENCOUNTER — Ambulatory Visit (INDEPENDENT_AMBULATORY_CARE_PROVIDER_SITE_OTHER): Payer: Medicare Other | Admitting: Advanced Practice Midwife

## 2021-09-27 ENCOUNTER — Other Ambulatory Visit (HOSPITAL_COMMUNITY)
Admission: RE | Admit: 2021-09-27 | Discharge: 2021-09-27 | Disposition: A | Discharge status: Home / Self Care | Payer: Medicare Other | Source: Ambulatory Visit | Attending: Advanced Practice Midwife | Admitting: Advanced Practice Midwife

## 2021-09-27 ENCOUNTER — Encounter: Payer: Self-pay | Admitting: Advanced Practice Midwife

## 2021-09-27 ENCOUNTER — Other Ambulatory Visit: Payer: Self-pay

## 2021-09-27 VITALS — BP 122/73 | HR 87 | Wt 144.0 lb

## 2021-09-27 DIAGNOSIS — O0993 Supervision of high risk pregnancy, unspecified, third trimester: Secondary | ICD-10-CM | POA: Diagnosis present

## 2021-09-27 DIAGNOSIS — O10919 Unspecified pre-existing hypertension complicating pregnancy, unspecified trimester: Secondary | ICD-10-CM

## 2021-09-27 DIAGNOSIS — O0990 Supervision of high risk pregnancy, unspecified, unspecified trimester: Secondary | ICD-10-CM

## 2021-09-27 NOTE — Patient Instructions (Signed)
Things to Try After 37 weeks to Encourage Labor/Get Ready for Labor:    Try the [employer] Circuit at www.milescircuit.com daily to improve baby's position and encourage the onset of labor.  Walk a little and rest a little every day.  Change positions often.  Cervical Ripening: May try one or both Red Raspberry Leaf capsules or tea:  two 300mg or 400mg tablets with each meal, 2-3 times a day, or 1-3 cups of tea daily  Potential Side Effects Of Raspberry Leaf:  Most women do not experience any side effects from drinking raspberry leaf tea. However, nausea and loose stools are possible   Evening Primrose Oil capsules: take 1 capsule by mouth and place one capsule in the vagina every night.    Some of the potential side effects:  Upset stomach  Loose stools or diarrhea  Headaches  Nausea  Sex can also help the cervix ripen and encourage labor onset.    Labor Precautions Reasons to come to [facility] at [facility]:  1.  Contractions are  5 minutes apart or less, each last 1 minute, these have been going on for 1-2 hours, and you cannot walk or talk during them 2.  You have a large gush of fluid, or a trickle of fluid that will not stop and you have to wear a pad 3.  You have bleeding that is bright red, heavier than spotting--like menstrual bleeding (spotting can be normal in early labor or after a check of your cervix) 4.  You do not feel the baby moving like he/she normally does  

## 2021-09-27 NOTE — Progress Notes (Signed)
   PRENATAL VISIT NOTE  Subjective:  Betty Logan is a 20 y.o. G1P0000 at [redacted]w[redacted]d being seen today for ongoing prenatal care.  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  Patient reports no complaints.  Contractions: Not present. Vag. Bleeding: None.  Movement: Present. Denies leaking of fluid.   The following portions of the patient's history were reviewed and updated as appropriate: allergies, current medications, past family history, past medical history, past social history, past surgical history and problem list.   Objective:   Vitals:   09/27/21 1448  BP: 122/73  Pulse: 87  Weight: 144 lb (65.3 kg)    Fetal Status: Fetal Heart Rate (bpm): 140 Fundal Height: 36 cm Movement: Present  Presentation: Vertex  General:  Alert, oriented and cooperative. Patient is in no acute distress.  Skin: Skin is warm and dry. No rash noted.   Cardiovascular: Normal heart rate noted  Respiratory: Normal respiratory effort, no problems with respiration noted  Abdomen: Soft, gravid, appropriate for gestational age.  Pain/Pressure: Absent     Pelvic: Cervical exam performed in the presence of a chaperone Dilation: Closed Effacement (%): 50 Station: -1  Extremities: Normal range of motion.  Edema: None  Mental Status: Normal mood and affect. Normal behavior. Normal judgment and thought content.   Assessment and Plan:  Pregnancy: G1P0000 at [redacted]w[redacted]d 1. Supervision of high risk pregnancy in third trimester --Anticipatory guidance about next visits/weeks of pregnancy  given. --Reviewed labor readiness with patient including the [facility] Circuit, evening primrose oil, and raspberry leaf tea.   --Next appt in 1 week  2. Chronic hypertension affecting pregnancy --Questions about IOL.  CHTN with all normal BP in pregnancy, no meds required. --MFM recommends 39-40 weeks. Pt desires low intervention so if BPs remain normal and no other problems, can schedule pt closer to 40 weeks.  See labor readiness above.  Preterm labor symptoms and general obstetric precautions including but not limited to vaginal bleeding, contractions, leaking of fluid and fetal movement were reviewed in detail with the patient. Please refer to After Visit Summary for other counseling recommendations.   Return in about 1 week (around 10/04/2021).  Future Appointments  Date Time Provider Department Center  10/06/2021  8:15 AM Gerrit Heck, CNM [facility] None  10/12/2021  1:50 PM Nugent, Odie Sera, NP [facility] None     Sharen Counter, CNM

## 2021-09-28 ENCOUNTER — Other Ambulatory Visit: Payer: Self-pay | Admitting: *Deleted

## 2021-09-28 DIAGNOSIS — A749 Chlamydial infection, unspecified: Secondary | ICD-10-CM

## 2021-09-28 LAB — CERVICOVAGINAL ANCILLARY ONLY
Chlamydia: POSITIVE — AB
Comment: NEGATIVE
Comment: NORMAL
Neisseria Gonorrhea: NEGATIVE

## 2021-09-28 MED ORDER — AZITHROMYCIN 250 MG PO TABS: 1000.0000 mg | ORAL_TABLET | Freq: Once | ORAL | 0 refills | Status: AC

## 2021-09-28 NOTE — Progress Notes (Signed)
Zithromax sent per protocol for +[facility]. Provider to be notified.

## 2021-10-01 LAB — CULTURE, BETA STREP (GROUP B ONLY): Strep Gp B Culture: NEGATIVE

## 2021-10-04 ENCOUNTER — Ambulatory Visit (INDEPENDENT_AMBULATORY_CARE_PROVIDER_SITE_OTHER): Payer: Medicare Other | Admitting: Licensed Clinical Social Worker

## 2021-10-04 DIAGNOSIS — F331 Major depressive disorder, recurrent, moderate: Secondary | ICD-10-CM

## 2021-10-05 NOTE — BH Specialist Note (Signed)
Integrated Behavioral Health via Telemedicine Visit  10/05/2021 Betty Logan [id number]  Number of Integrated Behavioral Health visits: 3 Session Start time: 10:00am  Session End time: 10:20am Total time: 20 mins via mychart video   Referring Provider: L. Leftwich Craige Cotta  Patient/Family location: Home  [facility] Provider location: [facility]  All persons participating in visit: Pt S Peeples and LCSW A. Kielan Dreisbach Types of Service: Individual psychotherapy  I connected with Betty Logan and/or Betty Logan's n/a via  Telephone or Engineer, civil (consulting)  (Video is Surveyor, mining) and verified that I am speaking with the correct person using two identifiers. Discussed confidentiality: Yes   I discussed the limitations of telemedicine and the availability of in person appointments.  Discussed there is a possibility of technology failure and discussed alternative modes of communication if that failure occurs.  I discussed that engaging in this telemedicine visit, they consent to the provision of behavioral healthcare and the services will be billed under their insurance.  Patient and/or legal guardian expressed understanding and consented to Telemedicine visit: Yes   Presenting Concerns: Patient and/or family reports the following symptoms/concerns: family conflict and depression  Duration of problem: over one year ; Severity of problem: mild  Patient and/or Family's Strengths/Protective Factors: Concrete supports in place (healthy food, safe environments, etc.)  Goals Addressed: Patient will:  Reduce symptoms of: depression and stress   Increase knowledge and/or ability of: coping skills   Demonstrate ability to: Increase healthy adjustment to current life circumstances  Progress towards Goals: Ongoing  Interventions: Interventions utilized:  Supportive Counseling Standardized Assessments completed: PHQ 9    Assessment: Patient currently experiencing  major depressive disorder.   Patient may benefit from community mental health.  Plan: Follow up with behavioral health clinician on : as needed  Behavioral recommendations: Refrain from family conflict and fighting, continue visits with preferred mental health provider  Referral(s): Integrated [employer] (In Clinic)  I discussed the assessment and treatment plan with the patient and/or parent/guardian. They were provided an opportunity to ask questions and all were answered. They agreed with the plan and demonstrated an understanding of the instructions.   They were advised to call back or seek an in-person evaluation if the symptoms worsen or if the condition fails to improve as anticipated.  Gwyndolyn Saxon, LCSW

## 2021-10-06 ENCOUNTER — Other Ambulatory Visit: Payer: Self-pay

## 2021-10-06 ENCOUNTER — Ambulatory Visit (INDEPENDENT_AMBULATORY_CARE_PROVIDER_SITE_OTHER): Payer: Medicare Other

## 2021-10-06 VITALS — BP 111/71 | HR 88 | Wt 146.0 lb

## 2021-10-06 DIAGNOSIS — O99340 Other mental disorders complicating pregnancy, unspecified trimester: Secondary | ICD-10-CM

## 2021-10-06 DIAGNOSIS — O98813 Other maternal infectious and parasitic diseases complicating pregnancy, third trimester: Secondary | ICD-10-CM

## 2021-10-06 DIAGNOSIS — O0993 Supervision of high risk pregnancy, unspecified, third trimester: Secondary | ICD-10-CM

## 2021-10-06 DIAGNOSIS — Z3A37 37 weeks gestation of pregnancy: Secondary | ICD-10-CM

## 2021-10-06 DIAGNOSIS — A749 Chlamydial infection, unspecified: Secondary | ICD-10-CM

## 2021-10-06 DIAGNOSIS — F32A Depression, unspecified: Secondary | ICD-10-CM

## 2021-10-06 DIAGNOSIS — O10919 Unspecified pre-existing hypertension complicating pregnancy, unspecified trimester: Secondary | ICD-10-CM

## 2021-10-06 MED ORDER — SERTRALINE HCL 25 MG PO TABS: 25.0000 mg | ORAL_TABLET | Freq: Every day | ORAL | 4 refills | Status: DC

## 2021-10-06 NOTE — Patient Instructions (Signed)
AREA PEDIATRIC/FAMILY PRACTICE PHYSICIANS  [place] ([address]) [facility] Chambliss, MD; Eniola, MD; Hale, MD; Hensel, MD; McDiarmid, MD; McIntyer, MD; Neal, MD; Walden, MD [address] [place], [place] [address] [phone] Mon-Fri 8:30-12:30, 1:30-5:00 Providers come to see babies at [facility] Accepting Medicaid [facility] providers who accept newborns: Koirala, MD; Morrow, MD; Wolters, MD [address], [place], [place] [address] [phone] Mon-Fri 8:00-5:30 Babies seen by providers at [facility] Does NOT accept Medicaid Please call early in hospitalization for appointment (limited availability)  [facility] Mulberry, MD [address] [place], [place] [address] [phone] Mon, Tue, Thur, Fri 8:30-5:00, Wed 10:00-7:00 (closed 1-2pm) Babies seen by [facility] providers Accepting Medicaid Rubin - Pediatrician Rubin, MD [address], [place], [place] [address] [phone] Mon-Fri 8:30-5:00, Sat 8:30-12:00 Provider comes to see babies at [facility] Accepting Medicaid Must have been referred from current patients or contacted office prior to delivery [facility] ([facility]) Brown, MD; Chandler, MD; Ettefagh, MD; Grant, MD; Lester, MD; McCormick, MD; McQueen, MD; Prose, MD; Simha, MD; Stanley, MD; Stryffeler, NP; Tebben, NP [address], [place], [place] [address] [phone] Mon, Tue, Thur, Fri 8:30-5:30, Wed 9:30-5:30, Sat 8:30-12:30 Babies seen by [facility] providers Accepting Medicaid Only accepting infants of first-time parents or siblings of current patients Hospital discharge coordinator will make follow-up appointment Jack Amos [address], [place], [place]  [address] [phone]   Fax - [phone] [facility] [address], [place], [place]  [address] Phone - [phone]   Fax - [phone] Shilpa Gosrani [address], [place], [place]  [address] [phone]  [place] ([address]) [facility] Bates, MD; Brassfield, MD; Cooper, Cox, MD; MD; Davis, MD; Dovico, MD; Ettefaugh, MD; Little, MD; Lowe, MD; Keiffer, MD; Melvin, MD; Sumner, MD; Williams, MD [address], [place], [place] [address] [phone] Mon-Fri 8:30-5:00 (extended evenings Mon-Thur as needed), Sat-Sun 10:00-1:00 Providers come to see babies at [facility] Accepting Medicaid for families of first-time babies and families with all children in the household age 3 and under. Must register with office prior to making appointment (M-F only). [facility] Henson, NP; Knapp, MD; Lalonde, MD; Tysinger, PA [address] [place], [place] [address] [phone] Mon-Fri 8:00-5:00 Babies seen by providers at [facility] Does NOT accept Medicaid/Commercial Insurance Only [facility] ([facility])  Artis, MD; Barnes, MD; Bratton, MD; Coccaro, MD; Lockett Gardner, MD; Kramer, MD; Marshall, MD; Netherton, MD; Poleto, MD; Skinner, MD [address] [place], [place] [address] [phone] Mon-Fri 8:30-5:30, Sat (Oct.-Mar.) 9:00-1:00 Babies seen by providers at [facility] Accepting Medicaid  [place] ([address]) [facility] Reid, MD; Warner, MD [address], [place], [place] [address] [phone] Mon-Fri 8:30-5:00, Sat 8:30-12:00 Providers come to see babies at [facility] Does NOT accept Medicaid [facility] Becker, PA; Hagler, MD; Scifres, PA; Sun, MD; Swayne, MD [address], [place],  [address] [phone] Mon-Fri 8:00-5:00 Babies seen by providers at [facility] Does NOT accept Medicaid Only accepting babies of parents who  are patients Please call early in hospitalization for appointment (limited availability)  Pediatricians Clark, MD; Frye, MD; Kelleher, MD; Mack, NP; Miller, MD; O'Keller, MD; Patterson, NP; Pudlo, MD; Puzio, MD; Thomas, MD; Tucker, MD; Twiselton, MD [address], [place], [place] [address] [phone] Mon-Fri 8:00-5:00, Sat 9:00-12:00 Providers come to see babies at [facility] Does NOT accept Medicaid  [place] ([address]) [facility] providers accepting new patients: Brake, NP; Wharton, PA [address], [place], [place] [address] [phone] Mon-Fri 8:00-5:00 Babies seen by providers at [facility] Does NOT accept Medicaid Only accepting babies of parents who are patients Please call early in hospitalization for appointment (limited availability) [facility] Gay, MD; Quinlan, MD [address] [place], [place] [address] [phone] (press 1 to schedule appointment) Mon-Fri 8:00-5:00 Providers come to see babies at [facility] Does NOT accept Medicaid [facility] Mazer, MD [address] [place], [place] [address] [phone] Mon-Fri 8:30-5:00 (lunch 12:30-1:00), extended hours by appointment only Wed 5:00-6:30 Babies seen by [facility] providers Accepting Medicaid [facility] Banks, MD; Jordan, MD; Koberlein, MD [address], [place], [place] [address] [phone] Mon-Fri 8:00-5:00 Babies seen by [facility] providers Does NOT accept Medicaid [facility] Parker, MD; Hunter, MD; Wallace, DO [address] [place], [place] [address] [phone] Mon-Fri 8:00-5:00 Babies seen by [facility] providers Does NOT accept Medicaid [facility] Brandon, PA; Brecken, PA; Christy, NP; Dees, MD; DeClaire, MD; DeWeese, MD; Hansen, NP; Mills, NP; Parrish, NP; Smoot, NP; Summer, MD; Vapne,  MD [address] [place], [place] [address] [phone] Mon-Fri 8:30-5:00, Sat 10:00-1:00 Providers come to see babies at [facility] Does NOT accept Medicaid Free prenatal information session Tuesdays at 4:45pm [facility] Bouska, MD; Gordon, PA; Jeffery, PA; Weber, PA [address] [place] [address] [phone] Mon-Fri 7:30-5:30 Babies seen by [facility] providers [facility] [address], [place], [place]  [address] [phone]   Fax - [phone]  [place] ([address] & [address]) [facility] [place] [address] [place], [place] [address] [phone] Mon-Thur 8:00-6:00 Providers come to see babies at [facility] Accepting Medicaid [facility] Anderson, NP; Badger, MD; Beal, PA; Spencer, PA [address] [place], [place] [address] [phone] Mon-Thur 7:30-7:30, Fri 7:30-4:30 Babies seen by [facility] providers Accepting Medicaid [facility] Agbuya, MD; Klett, NP; Romgoolam, MD [address], [place], [place] [address] [phone] Mon-Fri 8:30-5:00, Sat 8:30-12:00 Providers come to see babies at [facility] Accepting Medicaid Must have "Meet & Greet" appointment at office prior to delivery [facility] ([facility]) McCord, MD; Wallace, MD; Wood, MD [address], [place], [place] [address] [phone] Mon-Wed 8:00-6:00, Thur-Fri 8:00-5:00, Sat 9:00-12:00 Providers come to see babies at [facility] Does NOT accept Medicaid Only accepting siblings of current patients [facility]  [address], [place], [place]  [address] [phone]   Fax - [phone] [facility] [address], [place], [place]  [address] [phone]   Fax -  [phone]  [place] ([address] & [address]) [facility] Cirigliano, DO; Matthews, DO [address] [place], [place] [address] [phone] Mon-Fri 7:00-5:00 Babies seen by [facility] providers Does NOT accept Medicaid [facility] Briscoe, MD; Howley, PA; Moreira, PA [address], [place],  [address] [phone] Mon-Fri 8:00-5:00 Babies seen by [facility] providers Accepting Medicaid [facility] Boyd, MD; Church, PA; Jones, NP; Osborn, PA [address], ,  [address] [phone] Mon-Fri 8:00-5:00 Babies seen by providers at [facility] Accepting Medicaid  [place] ([address]) [facility] [place] Wendling, DO [address] [place], [place] [address] [phone] Mon-Fri 8:00-5:00 Babies seen by [facility] providers Does NOT accept Medicaid Limited availability, please call early in hospitalization to schedule follow-up [facility] Calderon, PA; Cummings, MD; Dillard, MD; Martin, PA; Olson, MD; VanDeven, PA [address], [place], [place] [address] [phone] Mon-Fri 8:30-5:00, Sat 9:00-12:00 Babies seen by providers at [facility] Accepting Medicaid Please register online then schedule online or call office [email or web address]) Hunter, NP; Kumar, MD; Martin Rogers, PA [address], [place], [place] [address] [phone] Mon-Fri 8:00-5:00 Babies seen by providers at [facility] Accepting Medicaid [facility] ([facility]) Dob[place], MD; Kristi Fleenor, NP; West, MD [address], [place], [place] [address] [phone] Mon-Fri 8:00-5:30, Sat&Sun by appointment (phones open at  8:30) Babies seen by [facility] providers Accepting Medicaid Must be a first-time baby or sibling of current patient [facility] [place]  [address], [place], [place]  [address] [phone]   Fax - [phone]  [place] ([address]) [facility] Brown, PA; Cowen, PA; Rice, MD; Helton, PA; Spry, MD [address] [place], [place] [address] [phone] Mon-Thur 8:00-7:00, Fri 8:00-5:00, Sat 8:00-12:00, Sun 9:00-12:00 Babies seen by [facility] providers Accepting Medicaid [facility] Coe-Goins, MD; Marshall, MD; Pierre-Louis, MD [address], [place], [place] [address] [phone] Mon-Thur 8:00-5:00 Babies seen by providers at [facility] Accepting Medicaid [facility] Bratton, MD; Coe-Goins, MD; Hayes, MD; Lewis, MD; List, MD; Lott, MD; Marshall, MD; Moran, MD; O'Neal, MD; Pierre-Louis, MD; Pitonzo, MD; Scholer, MD; Spangle, MD [address] [place], [place] [address] [phone] Mon-Fri 8:00-5:30, Sat (Oct.-Mar.) 9:00-1:00 Babies seen by providers at [facility] Accepting Medicaid Must fill out new patient packet, available online at [email or web address] [facility] ([facility]) Friddle, NP; Harris, NP; Kelly, NP; Logan, MD; Melvin, PA; Poth, MD; Ramadoss, MD; Stanton, NP [address], [place], [place] [address] [phone] Mon-Thur 8:00-5:30, Fri 8:00-5:00 Babies seen by providers at [facility] Accepting Medicaid  [place] ([address]) [facility] Dixon, PA; [place], MD; Pickard, MD; Tapia, PA [address], [place], [place] [address] [phone] Mon-Fri 8:00-5:00 Babies seen by providers at [facility] Accepting Medicaid   [place] ([address]) [facility] [place] Masneri, DO; Meyers, MD; Nelson, PA [address], [place], [place] [address] [phone] Mon-Fri 8:00-5:00 Babies seen by providers at [facility] Does NOT accept Medicaid Limited appointment availability, please call early in hospitalization  [facility] [place] Kunedd, DO; McGowen, MD [address], [place], [place] [address] [phone] Mon-Fri 8:00-5:00 Babies seen by [facility] providers Does NOT accept Medicaid [facility] [place] Cameron, MD; MacDonald, MD; Michaels, PA; Nayak, MD [address] [place] [address], [place], [place] [address] [phone] Mon-Fri 8:00-5:00 After hours clinic ([address] [place],  [address]) [phone] Mon-Fri 5:00-8:00, Sat 12:00-6:00, Sun 10:00-4:00 Babies seen by [facility] providers Accepting Medicaid [facility] [place] [address], [place], [place]  [address] [phone]   Fax - [phone]  [place] ([address]) [facility] [place] [facility] Andy, MD [address], [place], [place] [address] [phone] Mon-Fri 8:00-5:00 Babies seen by [facility] providers Does NOT accept Medicaid [facility] [place] ([facility] [place]) Eksir, MD [address], [place], [place] [address] [phone] Mon-Thur 8:00-7:00, Fri 8:00-5:00, Sat 8:00-12:00 Babies seen by providers at [facility] Accepting Medicaid - but does not have vaccinations in office (must be received elsewhere) Limited availability, please call early in hospitalization  [place] ([address]) [facility]  Charlene Flemming, MD [address], [place] [address] [phone]  Fax [phone]  [facility]  Kimberly Newton, MD, Annamarie Streilein, PA, Carla Hampton, PA [address] [place], [place]  [address] [phone] [facility]  [address], [place], [place] [address] [phone] [address], [place], [place] [address] [phone] (West Office)  [facility] [address], [place], [place] [address] [phone] [facility] [address] [place], [place], [place] [address] [phone] [facility] [address], [place], [place] [address] [phone] [facility] [address], [place], [place] [address] [phone] [facility] [address], [place], [place] [address] [phone] [facility] [address], [place], [place] [address] [phone] [facility]  [address], [place], [place] [address] [phone] Dr[facility] [address], [place], [place] [address] [phone] [facility] [address], [place], [place] [address] [phone] [facility] [address], [place], [place] [address] [phone]  

## 2021-10-06 NOTE — Progress Notes (Signed)
   HIGH-RISK PREGNANCY OFFICE VISIT  Patient name: Betty Logan MRN [id number]  Date of birth: [birth year] Chief Complaint:   Routine Prenatal Visit  Subjective:   Betty Logan is a 20 y.o. 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  Patient presents today, alone, with fatigue. She does endorse depression and states she has been talking with LCSW, but feels she is not improving.  She reports taking Zoloft in the past with good results.  Patient endorses fetal movement. Patient denies abdominal cramping or contractions.  Patient denies vaginal concerns including abnormal discharge, leaking of fluid, and bleeding.  Contractions: Not present. Vag. Bleeding: None.  Movement: Present.  Reviewed past medical,surgical, social, obstetrical and family history as well as problem list, medications and allergies.  Objective   Vitals:   10/06/21 0816  BP: 111/71  Pulse: 88  Weight: 146 lb (66.2 kg)  Body mass index is 23.57 kg/m.  Total Weight Gain:25 lb (11.3 kg)         Physical Examination:   General appearance: Well appearing, and in no distress  Mental status: Alert, oriented to person, place, and time  Skin: Warm & dry  Cardiovascular: Normal heart rate noted  Respiratory: Normal respiratory effort, no distress  Abdomen: Soft, gravid, nontender, AGA with    Pelvic: Cervical exam deferred           Extremities: Edema: None  Fetal Status:    Movement: Present   No results found for this or any  previous visit (from the past 24 hour(s)).  Assessment & Plan:  High-risk pregnancy of a 20 y.o., G1P0000 at [redacted]w[redacted]d with an Estimated Date of Delivery: 10/24/21   1. Supervision of high risk pregnancy in third trimester -Anticipatory guidance for upcoming appts. -Patient to schedule next appt in 1 weeks for an in-person visit.  2. [redacted] weeks gestation of pregnancy -Discussed complaints. -Patient states she has no resources for infant. -Has spoken with LCSW. -Given opportunity to go through infant clothing bags at office.   3. Chronic hypertension affecting pregnancy -Normotensive today. -No headaches or visual disturbances.   4. Depression affecting pregnancy -Denies SI/HI behaviors. -Reports aware of [address] 24[facility] behavioral health clinic.  -Discussed initiation of Zoloft for management of depression. Patient agreeable. -Rx for Zoloft 25 mg sent to pharmacy on file.  -Discussed potential need for increase in one week. -Cautioned of increased SI/HI during initial dosing.  5. Chlamydia infection affecting pregnancy in third trimester -Reports completing prescription same day as notification. -Unsure if partner was treated. -Plan to retest at 39 weeks.       Meds: No orders of the defined types were placed in this encounter.  Labs/procedures today:  Lab Orders  No laboratory test(s) ordered today     Reviewed: Term labor symptoms and general obstetric precautions including but not limited to vaginal bleeding, contractions, leaking of fluid and fetal movement were reviewed in detail with the patient.  All questions were answered.  Follow-up: No follow-ups on file.  No orders of the defined types were placed in this encounter.  Cherre Robins MSN, CNM 10/06/2021

## 2021-10-06 NOTE — Progress Notes (Addendum)
ROB scored 12 on PHQ-9, she has been speaking to Sue Lush and says it it NOT helping. Patient has no family support or friends.

## 2021-10-12 ENCOUNTER — Other Ambulatory Visit: Payer: Self-pay

## 2021-10-12 ENCOUNTER — Ambulatory Visit (INDEPENDENT_AMBULATORY_CARE_PROVIDER_SITE_OTHER): Payer: Medicare Other | Admitting: Family Medicine

## 2021-10-12 ENCOUNTER — Encounter: Payer: Self-pay | Admitting: Family Medicine

## 2021-10-12 VITALS — BP 125/80 | HR 98 | Wt 145.0 lb

## 2021-10-12 DIAGNOSIS — Z3A38 38 weeks gestation of pregnancy: Secondary | ICD-10-CM

## 2021-10-12 DIAGNOSIS — O98813 Other maternal infectious and parasitic diseases complicating pregnancy, third trimester: Secondary | ICD-10-CM

## 2021-10-12 DIAGNOSIS — A749 Chlamydial infection, unspecified: Secondary | ICD-10-CM

## 2021-10-12 DIAGNOSIS — F331 Major depressive disorder, recurrent, moderate: Secondary | ICD-10-CM

## 2021-10-12 DIAGNOSIS — O10919 Unspecified pre-existing hypertension complicating pregnancy, unspecified trimester: Secondary | ICD-10-CM

## 2021-10-12 DIAGNOSIS — O0993 Supervision of high risk pregnancy, unspecified, third trimester: Secondary | ICD-10-CM

## 2021-10-12 NOTE — Progress Notes (Signed)
   PRENATAL VISIT NOTE  Subjective:  Betty Logan is a 20 y.o. G1P0000 at [redacted]w[redacted]d being seen today for ongoing prenatal care.  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  Patient reports no complaints.  Contractions: Not present. Vag. Bleeding: None.  Movement: Present. Denies leaking of fluid.   The following portions of the patient's history were reviewed and updated as appropriate: allergies, current medications, past family history, past medical history, past social history, past surgical history and problem list.   Objective:   Vitals:   10/12/21 1338  BP: 125/80  Pulse: 98  Weight: 145 lb (65.8 kg)    Fetal Status: Fetal Heart Rate (bpm): 155 Fundal Height: 38 cm Movement: Present  General:  Alert, oriented and cooperative. Patient is in no acute distress.  Skin: Skin is warm and dry. No rash noted.   Cardiovascular: Normal heart rate noted.  Respiratory: Normal respiratory effort, no problems with respiration noted.  Abdomen: Soft, gravid, appropriate for gestational age.    Pelvic: Cervical exam deferred.   Extremities: Normal range of motion. No LE edema.   Mental Status: Normal mood and affect. Normal behavior. Normal judgment and thought content.   Assessment and Plan:  Pregnancy: G1P0000 at [redacted]w[redacted]d  1. Supervision of high risk pregnancy in third trimester 2. [redacted] weeks gestation of pregnancy Progressing well. FH and FHT within normal limits. Follow up in 1 week for follow up HR OB visit.   3. Chronic hypertension affecting  pregnancy Remains normotensive. No medications. Recommendation for IOL between 39-40 weeks. Patient reports request was sent last week to schedule this but unable to view induction appointment or orders. Request form for IOL completed again today. Orders placed.  4. MDD (major depressive disorder), recurrent episode, moderate (HCC) Started on Zoloft 25 mg last visit. Patient reports she has not started this yet because she has not had a chance to get to the pharmacy. Reports that her mood is stable and the same as it has been. Anxiety and depressive symptoms. No safety concerns. Interested in [facility] visits but wants to change providers. Will see if patient can see [facility] at [facility] location instead. Will continue to monitor closely. Reviewed symptoms that should warrant urgent evaluation and patient voiced understanding. Encouraged her to try starting Zoloft when she is able to.  5. Chlamydia infection affecting pregnancy in third trimester Positive on 09/27/21 and treated. No symptoms. Plan for TOC next visit.   Term labor symptoms and general obstetric precautions including but not limited to vaginal bleeding, contractions, leaking of fluid and fetal movement were reviewed in detail with the patient.  Please refer to After Visit Summary for other counseling recommendations.   Return in about 1 week (around 10/19/2021) for follow up HR OB visit as scheduled.  Future Appointments  Date Time Provider Department Center  10/19/2021  4:10 PM Nugent, Odie Sera, NP [facility] None    Worthy Rancher, MD

## 2021-10-14 ENCOUNTER — Other Ambulatory Visit (HOSPITAL_COMMUNITY): Payer: Self-pay | Admitting: Advanced Practice Midwife

## 2021-10-18 NOTE — BH Specialist Note (Signed)
Integrated Behavioral Health via Telemedicine Visit  10/18/2021 DORISE GANGI [id number]  Number of Integrated Behavioral Health visits: 1 Session Start time: 9:15  Session End time: 9:38 Total time:  23  Referring Provider: Jaynie Collins, MD Patient/Family location: [facility] Provider location: Center for [facility] at [facility]  All persons participating in visit: [name] and [facility] Kelvon Giannini   Types of Service: Individual psychotherapy and Video visit  I connected with Aslan Pasty Arch and/or Remington J Longwell's  n/a  via  Telephone or Engineer, civil (consulting)  (Video is Surveyor, mining) and verified that I am speaking with the correct person using two identifiers. Discussed confidentiality: Yes   I discussed the limitations of telemedicine and the availability of in person appointments.  Discussed there is a possibility of technology failure and discussed alternative modes of communication if that failure occurs.  I discussed that engaging in this telemedicine visit, they consent to the provision of behavioral healthcare and the services will be billed under their insurance.  Patient and/or legal guardian expressed understanding and consented to Telemedicine visit: Yes   Presenting Concerns: Patient and/or family reports the following symptoms/concerns: Adjusting to new motherhood with limited support and breastfeeding difficulty; fatigue, depression, poor sleep quality, poor appetite; pt has not picked up Zoloft to begin taking. Duration of problem: Postpartum; hx MDD in past; Severity of problem: moderate  Patient and/or Family's Strengths/Protective Factors: Concrete supports in place (healthy food, safe environments, etc.) and Sense of purpose  Goals Addressed: Patient will:  Reduce symptoms of: anxiety, depression, and stress   Increase knowledge and/or ability of: healthy habits   Demonstrate  ability to: Increase healthy adjustment to current life circumstances and Increase adequate support systems for patient/family  Progress towards Goals: Ongoing  Interventions: Interventions utilized:  Solution-Focused Strategies, Psychoeducation and/or Health Education, and Link to [employer] Standardized Assessments completed:  PHQ9/GAD7 and Inocente Salles all given within past 2 weeks  Patient and/or Family Response: Pt agrees with treatment plan  Assessment: Patient currently experiencing Major depressive disorder, recurrent, moderate and Psychosocial stress.   Patient may benefit from psychoeducation and brief therapeutic interventions regarding coping with symptoms of depression, anxiety, stress .  Plan: Follow up with behavioral health clinician on : Three weeks; Call Asher Muir at [phone], as needed Behavioral recommendations:  -Consider continue taking prenatal vitamin until bottle runs out -Begin taking Zoloft as prescribed -Prioritize sleeping when baby sleeps for next two weeks (especially baby's morning nap time) -Consider new mom support groups at either  www.conehealthybaby.com and/or www.[email or web address].net  -Expect call from Lactation today to schedule appointment Referral(s): Integrated Art gallery manager (In Clinic), Community Resources:  New mom support, and Lactation  I discussed the assessment and treatment plan with the patient and/or parent/guardian. They were provided an opportunity to ask questions and all were answered. They agreed with the plan and demonstrated an understanding of the instructions.   They were advised to call back or seek an in-person evaluation if the symptoms worsen or if the condition fails to improve as anticipated.  Rae Lips, LCSW  Depression screen [facility] 2/9 10/06/2021 08/04/2021 03/23/2021 11/25/2018 04/22/2018  Decreased Interest 2 0 0 0 3  Down, Depressed, Hopeless 2 1 0 3 3  PHQ - 2 Score 4 1 0 3 6  Altered sleeping 3  3 0 3 0  Tired, decreased energy 3 0 0 0 3  Change in appetite 2 0 1 0 3  Feeling bad  or failure about yourself  0 1 0 0 0  Trouble concentrating 0 3 0 3 0  Moving slowly or fidgety/restless 0 0 0 0 0  Suicidal thoughts 0 0 0 0 0  PHQ-9 Score 12 8 1 9 12   Difficult doing work/chores Very difficult - - Very difficult Extremely dIfficult   GAD 7 : Generalized Anxiety Score 08/04/2021 03/23/2021  Nervous, Anxious, on Edge 0 0  Control/stop worrying 0 0  Worry too much - different things 0 0  Trouble relaxing 0 0  Restless 0 0  Easily annoyed or irritable 3 3  Afraid - awful might happen 0 0  Total GAD 7 Score 3 3   Edinburgh Postnatal Depression Scale Screening Tool 10/26/2021  I have been able to laugh and see the funny side of things. 0  I have looked forward with enjoyment to things. 0  I have blamed myself unnecessarily when things went wrong. 0  I have been anxious or worried for no good reason. 0  I have felt scared or panicky for no good reason. 0  Things have been getting on top of me. 0  I have been so unhappy that I have had difficulty sleeping. 0  I have felt sad or miserable. 0  I have been so unhappy that I have been crying. 0  The thought of harming myself has occurred to me. 0  Edinburgh Postnatal Depression Scale Total 0

## 2021-10-19 ENCOUNTER — Ambulatory Visit (INDEPENDENT_AMBULATORY_CARE_PROVIDER_SITE_OTHER): Payer: Medicare Other | Admitting: Obstetrics

## 2021-10-19 ENCOUNTER — Other Ambulatory Visit: Payer: Self-pay

## 2021-10-19 ENCOUNTER — Encounter: Payer: Self-pay | Admitting: Obstetrics

## 2021-10-19 VITALS — BP 117/72 | HR 85 | Wt 145.0 lb

## 2021-10-19 DIAGNOSIS — O10919 Unspecified pre-existing hypertension complicating pregnancy, unspecified trimester: Secondary | ICD-10-CM

## 2021-10-19 DIAGNOSIS — O0993 Supervision of high risk pregnancy, unspecified, third trimester: Secondary | ICD-10-CM

## 2021-10-19 NOTE — Progress Notes (Signed)
Subjective:  Betty Logan is a 20 y.o. G1P0000 at [redacted]w[redacted]d being seen today for ongoing prenatal care.  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  Patient reports occasional contractions.  Contractions: Irregular. Vag. Bleeding: None.  Movement: Present. Denies leaking of fluid.   The following portions of the patient's history were reviewed and updated as appropriate: allergies, current medications, past family history, past medical history, past social history, past surgical history and problem list. Problem list updated.  Objective:   Vitals:   10/19/21 1558  BP: 117/72  Pulse: 85  Weight: 145 lb (65.8 kg)    Fetal Status: Fetal Heart Rate (bpm): 147   Movement: Present     General:  Alert, oriented and cooperative. Patient is in no acute distress.  Skin: Skin is warm and dry. No rash noted.   Cardiovascular: Normal heart rate noted  Respiratory: Normal respiratory effort, no problems with respiration noted  Abdomen: Soft, gravid, appropriate for gestational age. Pain/Pressure: Present     Pelvic:  Cervical exam performed      1-2 cm / 70% / -2 / Vtx  Extremities: Normal range of motion.  Edema: Trace  Mental Status: Normal mood and affect. Normal behavior. Normal judgment and thought content.   Urinalysis:      Assessment and Plan:  Pregnancy: G1P0000 at [redacted]w[redacted]d  1. Supervision of high risk pregnancy in third trimester  2. Chronic hypertension affecting pregnancy - clinically stable BP's, no meds.  I - IOL at 40 weeks scheduled   Term labor  symptoms and general obstetric precautions including but not limited to vaginal bleeding, contractions, leaking of fluid and fetal movement were reviewed in detail with the patient. Please refer to After Visit Summary for other counseling recommendations.   Return IOL: 10-24-2021, for F/ U postpartum.    Brock Bad, MD 10/19/2021 4:21 PM

## 2021-10-19 NOTE — Progress Notes (Signed)
ROB, she has an Induction scheduled for 10/24/21

## 2021-10-24 ENCOUNTER — Encounter (HOSPITAL_COMMUNITY): Payer: Self-pay | Admitting: Family Medicine

## 2021-10-24 ENCOUNTER — Other Ambulatory Visit: Payer: Self-pay

## 2021-10-24 ENCOUNTER — Inpatient Hospital Stay (HOSPITAL_COMMUNITY)
Admission: AD | Admit: 2021-10-24 | Discharge: 2021-10-27 | DRG: 805 | Disposition: A | Discharge status: Home / Self Care | Payer: Medicare Other | Attending: Obstetrics & Gynecology | Admitting: Obstetrics & Gynecology

## 2021-10-24 ENCOUNTER — Inpatient Hospital Stay (HOSPITAL_COMMUNITY): Payer: Medicare Other

## 2021-10-24 DIAGNOSIS — Z87891 Personal history of nicotine dependence: Secondary | ICD-10-CM | POA: Diagnosis not present

## 2021-10-24 DIAGNOSIS — Z23 Encounter for immunization: Secondary | ICD-10-CM | POA: Diagnosis present

## 2021-10-24 DIAGNOSIS — O1092 Unspecified pre-existing hypertension complicating childbirth: Secondary | ICD-10-CM | POA: Diagnosis not present

## 2021-10-24 DIAGNOSIS — O9852 Other viral diseases complicating childbirth: Secondary | ICD-10-CM | POA: Diagnosis present

## 2021-10-24 DIAGNOSIS — U071 COVID-19: Secondary | ICD-10-CM | POA: Diagnosis present

## 2021-10-24 DIAGNOSIS — Z3A40 40 weeks gestation of pregnancy: Secondary | ICD-10-CM | POA: Diagnosis not present

## 2021-10-24 DIAGNOSIS — O326XX0 Maternal care for compound presentation, not applicable or unspecified: Secondary | ICD-10-CM | POA: Diagnosis not present

## 2021-10-24 DIAGNOSIS — O1002 Pre-existing essential hypertension complicating childbirth: Secondary | ICD-10-CM | POA: Diagnosis present

## 2021-10-24 DIAGNOSIS — Z227 Latent tuberculosis: Secondary | ICD-10-CM

## 2021-10-24 HISTORY — DX: Other complications of anesthesia, initial encounter: T88.59XA

## 2021-10-24 LAB — CBC
HCT: 35.6 % — ABNORMAL LOW (ref 36.0–46.0)
Hemoglobin: 12.5 g/dL (ref 12.0–15.0)
MCH: 32.6 pg (ref 26.0–34.0)
MCHC: 35.1 g/dL (ref 30.0–36.0)
MCV: 93 fL (ref 80.0–100.0)
Platelets: 242 10*3/uL (ref 150–400)
RBC: 3.83 MIL/uL — ABNORMAL LOW (ref 3.87–5.11)
RDW: 13.1 % (ref 11.5–15.5)
WBC: 8 10*3/uL (ref 4.0–10.5)
nRBC: 0 % (ref 0.0–0.2)

## 2021-10-24 MED ORDER — MISOPROSTOL 25 MCG QUARTER TABLET: 25.0000 ug | ORAL_TABLET | ORAL | Status: DC | PRN

## 2021-10-24 MED ORDER — OXYTOCIN BOLUS FROM INFUSION
333.0000 mL | Freq: Once | INTRAVENOUS | Status: AC
  Administered 2021-10-26: 01:00:00 333 mL via INTRAVENOUS

## 2021-10-24 MED ORDER — MISOPROSTOL 25 MCG QUARTER TABLET
25.0000 ug | ORAL_TABLET | ORAL | Status: DC | PRN
  Administered 2021-10-24 – 2021-10-25 (×2): 25 ug via ORAL
  Filled 2021-10-24 (×2): qty 1

## 2021-10-24 MED ORDER — TERBUTALINE SULFATE 1 MG/ML IJ SOLN: 0.2500 mg | Freq: Once | INTRAMUSCULAR | Status: DC | PRN

## 2021-10-24 MED ORDER — LIDOCAINE HCL (PF) 1 % IJ SOLN: 30.0000 mL | INTRAMUSCULAR | Status: DC | PRN

## 2021-10-24 MED ORDER — LACTATED RINGERS IV SOLN: INTRAVENOUS | Status: DC

## 2021-10-24 MED ORDER — SOD CITRATE-CITRIC ACID 500-334 MG/5ML PO SOLN: 30.0000 mL | ORAL | Status: DC | PRN

## 2021-10-24 MED ORDER — MISOPROSTOL 25 MCG QUARTER TABLET
ORAL_TABLET | ORAL | Status: AC
  Filled 2021-10-24: qty 1

## 2021-10-24 MED ORDER — FENTANYL CITRATE (PF) 100 MCG/2ML IJ SOLN
50.0000 ug | INTRAMUSCULAR | Status: DC | PRN
  Administered 2021-10-25: 16:00:00 100 ug via INTRAVENOUS
  Administered 2021-10-25: 07:00:00 50 ug via INTRAVENOUS
  Filled 2021-10-24 (×2): qty 2

## 2021-10-24 MED ORDER — ONDANSETRON HCL 4 MG/2ML IJ SOLN: 4.0000 mg | Freq: Four times a day (QID) | INTRAMUSCULAR | Status: DC | PRN

## 2021-10-24 MED ORDER — ACETAMINOPHEN 325 MG PO TABS: 650.0000 mg | ORAL_TABLET | ORAL | Status: DC | PRN

## 2021-10-24 MED ORDER — OXYTOCIN-SODIUM CHLORIDE 30-0.9 UT/500ML-% IV SOLN
2.5000 [IU]/h | INTRAVENOUS | Status: DC
  Administered 2021-10-26: 01:00:00 2.5 [IU]/h via INTRAVENOUS
  Filled 2021-10-24: qty 500

## 2021-10-24 MED ORDER — LACTATED RINGERS IV SOLN
500.0000 mL | INTRAVENOUS | Status: DC | PRN
  Administered 2021-10-25: 16:00:00 500 mL via INTRAVENOUS

## 2021-10-24 NOTE — H&P (Signed)
Betty Logan is a 20 y.o. female, G1P0 at 40 weeks, presenting for IOL s/t CHTN.  Patient receives care at [facility] and was supervised for a high-risk pregnancy. Pregnancy and medical history significant for problems as listed below. She is GBS Negative and expresses a desire for low intervention birth.  She is anticipating a female infant and requests Depo for PP birth control method.     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 Aron Baba. Cervical Exam: 1-2/70/-2, Vtx BP: 117/72  Pulse: 85  Weight: 145 lb (65.8 kg)     Nursing Staff Provider  Office Location  [facility] Dating  LMP  Language  English Anatomy US  Normal but incomplete, f/u end of August 2022  Flu Vaccine  Received 07/08/21 Genetic/Carrier Screen  NIPS: Low risk female AFP:  Not done Horizon: neg  TDaP Vaccine   Declined 08/04/21 Hgb A1C or  GTT Early  Third trimester   COVID Vaccine Vaccinated   LAB RESULTS   Rhogam  N/A Blood Type O/Positive/-- (05/25 1306)   Baby Feeding Plan Breast Antibody Positive, See Final Results (05/25 1306) Anti-M  Contraception Depo Rubella 1.00 (05/25 1306)  Circumcision N/A RPR Non Reactive (09/29 1041)   Pediatrician  [facility] HBsAg Negative (05/25 1306)   Support Person Brittany-Cousin HCVAb Negative  Prenatal Classes   HIV Non Reactive (09/29 1041)     BTL Consent NA GBS   (For PCN allergy, check sensitivities)   VBAC Consent NA Pap Due PP was 20 yo at new OB       BP Cuff Ordered 03/23/21 Waterbirth  [ ]  Class [ ]  Consent [ ]  CNM visit  PHQ9 & GAD7 [x  ] new OB  ] 28 weeks  ] 36 weeks Induction  [ ]  Orders Entered [ ] Foley Y/N     OB History     Gravida  1   Para  0   Term  0   Preterm  0   AB  0   Living  0      SAB  0   IAB  0   Ectopic  0   Multiple  0   Live Births  0             Past Medical History:  Diagnosis Date   Complication of anesthesia    Deliberate medication overdose (HCC)    Depression    Hypertension    Past Surgical History:  Procedure Laterality Date   EYE SURGERY     Family History: family history includes Hypertension in her mother. Social History:  reports that she has quit smoking. She has never used smokeless tobacco. She reports that she does not currently use alcohol. She reports that she does not currently use drugs after having used the following drugs: Marijuana.   Prenatal Transfer Tool  Maternal Diabetes: No Genetic Screening:  Normal Maternal Ultrasounds/Referrals: Normal Fetal Ultrasounds or other Referrals:  None Maternal Substance Abuse:  No Significant Maternal Medications:  None Significant Maternal Lab Results: Group B Strep negative and Other: CT Positive 11/22, TOC Pending   Maternal Assessment:  ROS: -Contractions, -LOF, -Vaginal Bleeding, +Fetal Movement  All other systems reviewed and negative.    Allergies  Allergen Reactions   Cherry Swelling    pineapples     Dilation: Closed Effacement (%): Thick Station: -3 Exam by:: Gavin Pound CNM Blood pressure 127/76, pulse 86, temperature 98.1 F (36.7 C), temperature source Oral, resp. rate 18, height 5\' 6"  (1.676 m), weight 65.3 kg, last menstrual period 01/17/2021.  Physical Exam Vitals reviewed. Exam conducted with a chaperone present.   Constitutional:      Appearance: Normal appearance.  HENT:     Head: Normocephalic and atraumatic.  Eyes:     Conjunctiva/sclera: Conjunctivae normal.  Cardiovascular:     Rate and Rhythm: Normal rate.     Heart sounds: Normal heart sounds.  Pulmonary:     Effort: Pulmonary effort is normal. No respiratory distress.     Breath sounds: Normal breath sounds.  Abdominal:     General: Bowel sounds are normal.     Palpations: Abdomen is soft.     Comments: Gravid, Appears AGA  Genitourinary:    General: Normal vulva.     Comments: GC/CT Collected Blindly  Dilation: Closed Effacement (%): Thick Station: -3 Presentation: Vertex (confirmed via ultrasound) Exam by:: Gavin Pound CNM  Musculoskeletal:        General: Normal range of motion.     Cervical back: Normal range of motion.  Skin:    General: Skin is warm and dry.  Neurological:     Mental Status: She is alert and oriented to person, place, and time.  Psychiatric:        Mood and Affect: Mood normal.        Behavior: Behavior normal.        Thought Content: Thought content normal.    Fetal Assessment: Leopolds: -Pelvis:Adequate -EFW: 7lbs 8oz -Presentation: Vertex by [place]  FHR: 135 bpm, Mod Var, -Decels, +Accels UCs:  Q2-71min, palpates mild to moderate    Assessment IUP at 40 weeks IOL CHTN; No Meds Recent CT without TOC Latent TB  Plan: -Admit to [facility]  -Routine Labor and Delivery Orders per Protocol -In room to complete assessment and discuss POC: -Discussed r/b of induction including fetal distress, serial induction, pain, and increased risk of c/s delivery -Discussed induction methods including cervical ripening agents, foley bulbs, and pitocin -Patient verbalizes understanding and wishes to proceed with induction process -Will start with oral cytotec. -[place] confirms vertex and fetal eye sockets noted concerning for Direct OP presentation. -Okay for pain medications as desired. -Swabs  collected. Will treat appropriately.  Loann Quill, MSN 10/24/2021, 10:19 PM

## 2021-10-25 ENCOUNTER — Inpatient Hospital Stay (HOSPITAL_COMMUNITY): Payer: Medicare Other | Admitting: Anesthesiology

## 2021-10-25 LAB — COMPREHENSIVE METABOLIC PANEL
ALT: 15 U/L (ref 0–44)
AST: 20 U/L (ref 15–41)
Albumin: 3.1 g/dL — ABNORMAL LOW (ref 3.5–5.0)
Alkaline Phosphatase: 132 U/L — ABNORMAL HIGH (ref 38–126)
Anion gap: 7 (ref 5–15)
BUN: 7 mg/dL (ref 6–20)
CO2: 22 mmol/L (ref 22–32)
Calcium: 9.1 mg/dL (ref 8.9–10.3)
Chloride: 106 mmol/L (ref 98–111)
Creatinine, Ser: 0.53 mg/dL (ref 0.44–1.00)
GFR, Estimated: >60 mL/min (ref >60)
Glucose, Bld: 84 mg/dL (ref 70–99)
Potassium: 4.4 mmol/L (ref 3.5–5.1)
Sodium: 135 mmol/L (ref 135–145)
Total Bilirubin: 0.6 mg/dL (ref 0.3–1.2)
Total Protein: 6.2 g/dL — ABNORMAL LOW (ref 6.5–8.1)

## 2021-10-25 LAB — RESP PANEL BY RT-PCR (FLU A&B, COVID) ARPGX2
Influenza A by PCR: NEGATIVE
Influenza B by PCR: NEGATIVE
SARS Coronavirus 2 by RT PCR: POSITIVE — AB

## 2021-10-25 LAB — CBC
HCT: 34.6 % — ABNORMAL LOW (ref 36.0–46.0)
Hemoglobin: 12.1 g/dL (ref 12.0–15.0)
MCH: 32.4 pg (ref 26.0–34.0)
MCHC: 35 g/dL (ref 30.0–36.0)
MCV: 92.5 fL (ref 80.0–100.0)
Platelets: 209 10*3/uL (ref 150–400)
RBC: 3.74 MIL/uL — ABNORMAL LOW (ref 3.87–5.11)
RDW: 12.9 % (ref 11.5–15.5)
WBC: 9.6 10*3/uL (ref 4.0–10.5)
nRBC: 0 % (ref 0.0–0.2)

## 2021-10-25 LAB — GC/CHLAMYDIA PROBE AMP (~~LOC~~) NOT AT ARMC
Chlamydia: NEGATIVE
Comment: NEGATIVE
Comment: NORMAL
Neisseria Gonorrhea: NEGATIVE

## 2021-10-25 LAB — RPR: RPR Ser Ql: NONREACTIVE

## 2021-10-25 LAB — ABO/RH: ABO/RH(D): O POS

## 2021-10-25 MED ORDER — MISOPROSTOL 50MCG HALF TABLET
50.0000 ug | ORAL_TABLET | ORAL | Status: DC | PRN
  Administered 2021-10-25: 10:00:00 50 ug via BUCCAL

## 2021-10-25 MED ORDER — DIPHENHYDRAMINE HCL 50 MG/ML IJ SOLN: 12.5000 mg | INTRAMUSCULAR | Status: DC | PRN

## 2021-10-25 MED ORDER — EPHEDRINE 5 MG/ML INJ: 10.0000 mg | INTRAVENOUS | Status: DC | PRN

## 2021-10-25 MED ORDER — TERBUTALINE SULFATE 1 MG/ML IJ SOLN: 0.2500 mg | Freq: Once | INTRAMUSCULAR | Status: DC | PRN

## 2021-10-25 MED ORDER — OXYTOCIN-SODIUM CHLORIDE 30-0.9 UT/500ML-% IV SOLN
1.0000 m[IU]/min | INTRAVENOUS | Status: DC
  Administered 2021-10-25: 15:00:00 2 m[IU]/min via INTRAVENOUS

## 2021-10-25 MED ORDER — PHENYLEPHRINE 40 MCG/ML (10ML) SYRINGE FOR IV PUSH (FOR BLOOD PRESSURE SUPPORT): 80.0000 ug | PREFILLED_SYRINGE | INTRAVENOUS | Status: DC | PRN

## 2021-10-25 MED ORDER — LACTATED RINGERS IV SOLN: 500.0000 mL | Freq: Once | INTRAVENOUS | Status: DC

## 2021-10-25 MED ORDER — PHENYLEPHRINE 40 MCG/ML (10ML) SYRINGE FOR IV PUSH (FOR BLOOD PRESSURE SUPPORT)
80.0000 ug | PREFILLED_SYRINGE | INTRAVENOUS | Status: DC | PRN
  Filled 2021-10-25: qty 10

## 2021-10-25 MED ORDER — FENTANYL-BUPIVACAINE-NACL 0.5-0.125-0.9 MG/250ML-% EP SOLN
12.0000 mL/h | EPIDURAL | Status: DC | PRN
  Administered 2021-10-25: 19:00:00 12 mL/h via EPIDURAL
  Filled 2021-10-25: qty 250

## 2021-10-25 MED ORDER — MISOPROSTOL 50MCG HALF TABLET
ORAL_TABLET | ORAL | Status: AC
  Filled 2021-10-25: qty 1

## 2021-10-25 MED ORDER — LIDOCAINE-EPINEPHRINE (PF) 2 %-1:200000 IJ SOLN
INTRAMUSCULAR | Status: DC | PRN
  Administered 2021-10-25: 5 mL via EPIDURAL

## 2021-10-25 NOTE — Anesthesia Preprocedure Evaluation (Signed)
Anesthesia Evaluation  Patient identified by MRN, date of birth, ID band Patient awake    Reviewed: Allergy & Precautions, NPO status , Patient's Chart, lab work & pertinent test results  Airway Mallampati: II  TM Distance: >3 FB Neck ROM: Full    Dental no notable dental hx.    Pulmonary neg pulmonary ROS, former smoker,  Covid positive, symptoms include sore throat   Pulmonary exam normal breath sounds clear to auscultation       Cardiovascular hypertension, Normal cardiovascular exam Rhythm:Regular Rate:Normal     Neuro/Psych PSYCHIATRIC DISORDERS Anxiety Depression negative neurological ROS     GI/Hepatic negative GI ROS, Neg liver ROS,   Endo/Other  negative endocrine ROS  Renal/GU negative Renal ROS  negative genitourinary   Musculoskeletal negative musculoskeletal ROS (+)   Abdominal   Peds  Hematology negative hematology ROS (+)   Anesthesia Other Findings IOL for cHTN  Reproductive/Obstetrics (+) Pregnancy                             Anesthesia Physical Anesthesia Plan  ASA: 3  Anesthesia Plan: Epidural   Post-op Pain Management:    Induction:   PONV Risk Score and Plan: Treatment may vary due to age or medical condition  Airway Management Planned: Natural Airway  Additional Equipment:   Intra-op Plan:   Post-operative Plan:   Informed Consent: I have reviewed the patients History and Physical, chart, labs and discussed the procedure including the risks, benefits and alternatives for the proposed anesthesia with the patient or authorized representative who has indicated his/her understanding and acceptance.       Plan Discussed with: Anesthesiologist  Anesthesia Plan Comments: (Patient identified. Risks, benefits, options discussed with patient including but not limited to bleeding, infection, nerve damage, paralysis, failed block, incomplete pain control,  headache, blood pressure changes, nausea, vomiting, reactions to medication, itching, and post partum back pain. Confirmed with bedside nurse the patient's most recent platelet count. Confirmed with the patient that they are not taking any anticoagulation, have any bleeding history or any family history of bleeding disorders. Patient expressed understanding and wishes to proceed. All questions were answered. )        Anesthesia Quick Evaluation

## 2021-10-25 NOTE — Anesthesia Procedure Notes (Signed)
Epidural Patient location during procedure: OB Start time: 10/25/2021 6:40 PM End time: 10/25/2021 6:50 PM  Staffing Anesthesiologist: Elmer Picker, MD Performed: anesthesiologist   Preanesthetic Checklist Completed: patient identified, IV checked, risks and benefits discussed, monitors and equipment checked, pre-op evaluation and timeout performed  Epidural Patient position: sitting Prep: DuraPrep and site prepped and draped Patient monitoring: continuous pulse ox, blood pressure, heart rate and cardiac monitor Approach: midline Location: L3-L4 Injection technique: LOR air  Needle:  Needle type: Tuohy  Needle gauge: 17 G Needle length: 9 cm Needle insertion depth: 4 cm Catheter type: closed end flexible Catheter size: 19 Gauge Catheter at skin depth: 10 cm Test dose: negative  Assessment Sensory level: T8 Events: blood not aspirated, injection not painful, no injection resistance, no paresthesia and negative IV test  Additional Notes Patient identified. Risks/Benefits/Options discussed with patient including but not limited to bleeding, infection, nerve damage, paralysis, failed block, incomplete pain control, headache, blood pressure changes, nausea, vomiting, reactions to medication both or allergic, itching and postpartum back pain. Confirmed with bedside nurse the patient's most recent platelet count. Confirmed with patient that they are not currently taking any anticoagulation, have any bleeding history or any family history of bleeding disorders. Patient expressed understanding and wished to proceed. All questions were answered. Sterile technique was used throughout the entire procedure. Please see nursing notes for vital signs. Test dose was given through epidural catheter and negative prior to continuing to dose epidural or start infusion. Warning signs of high block given to the patient including shortness of breath, tingling/numbness in hands, complete motor block,  or any concerning symptoms with instructions to call for help. Patient was given instructions on fall risk and not to get out of bed. All questions and concerns addressed with instructions to call with any issues or inadequate analgesia.  Reason for block:procedure for pain

## 2021-10-25 NOTE — Progress Notes (Signed)
Betty Logan is a 20 y.o. G1P0000 at [redacted]w[redacted]d   Subjective: Patient sleeping on CNM entry to room. Denies questions, concerns. Requests assistance up to BR  Objective: BP 117/75    Pulse 84    Temp 98.5 F (36.9 C) (Oral)    Resp 18    Ht 5\' 6"  (1.676 m)    Wt 65.3 kg    LMP 01/17/2021 (Exact Date)    BMI 23.23 kg/m  No intake/output data recorded. Total I/O In: 1473.6 [P.O.:60; I.V.:1413.6] Out: -   FHT:  FHR: 130 bpm, variability: moderate,  accelerations:  Present,  decelerations:  Absent UC:   irregular, every 2-5 minutes SVE:   Dilation: 1 Effacement (%): 30 Station: -3 Exam by:: K.Louis-Charles, RN  Labs: Lab Results  Component Value Date   WBC 8.0 10/24/2021   HGB 12.5 10/24/2021   HCT 35.6 (L) 10/24/2021   MCV 93.0 10/24/2021   PLT 242 10/24/2021    Assessment / Plan: --20 y.o. G1P0000 at [redacted]w[redacted]d  --IOL CHTN (no meds) --Normotensive, asymptomatic --Cat I tracing --GBS Neg --COVID +, asymptomatic  Labor --S/p Cytotec at 0237 --Sleeping through contractions, will place additional --Foley bulb placed at 0300 this morning, remains in place --Plan for Pitocin initiation and titration once foley bulb is dislodged --Anticipate vaginal birth  [redacted]w[redacted]d, Calvert Cantor 10/25/2021, [phone]

## 2021-10-25 NOTE — Progress Notes (Signed)
Betty Logan is a 20 y.o. G1P0000 at [redacted]w[redacted]d   Subjective: Denies pain, pressure, questions or concerns  Objective: BP 125/83    Pulse 88    Temp 98.6 F (37 C) (Oral)    Resp 16    Ht 5\' 6"  (1.676 m)    Wt 65.3 kg    LMP 01/17/2021 (Exact Date)    BMI 23.23 kg/m  No intake/output data recorded. Total I/O In: 1473.6 [P.O.:60; I.V.:1413.6] Out: -   FHT:  FHR: 130 bpm, variability: moderate,  accelerations:  Present,  decelerations:  Absent UC:   regular, every 2-4 minutes SVE:   Dilation: 4 Effacement (%): 50 Station: -1 Exam by:: [email or web address], RN  Labs: Lab Results  Component Value Date   WBC 8.0 10/24/2021   HGB 12.5 10/24/2021   HCT 35.6 (L) 10/24/2021   MCV 93.0 10/24/2021   PLT 242 10/24/2021   Patient Vitals for the past 24 hrs:  BP Temp Temp src Pulse Resp Height Weight  10/25/21 1600 125/83 -- -- 88 16 -- --  10/25/21 1530 120/81 -- -- 92 16 -- --  10/25/21 1524 -- 98.6 F (37 C) Oral -- -- -- --  10/25/21 1519 125/86 -- -- (!) 101 16 -- --  10/25/21 1300 117/75 -- -- 80 -- -- --  10/25/21 1237 -- 98.5 F (36.9 C) Oral -- 16 -- --  10/25/21 1200 117/81 -- -- 90 16 -- --  10/25/21 1100 116/80 -- -- 86 -- -- --  10/25/21 1052 117/75 -- -- 84 18 -- --  10/25/21 0900 103/70 -- -- (!) 103 18 -- --  10/25/21 0800 117/75 98.4 F (36.9 C) Oral 96 18 -- --   Assessment / Plan: --20 y.o. G1P0000 at [redacted]w[redacted]d  --IOL CHTN (no meds), normotensive, asymptomatic --Cat I tracing --GBS Neg --S/p Cytotec and foley balloon --Initiate Pitocin titration, CNM at bedside to discuss indication for Pitocin, expectations for sensation --May have epidural on request --Anticipate vaginal delivery  [redacted]w[redacted]d, CNM 10/25/2021, 1515

## 2021-10-25 NOTE — Progress Notes (Signed)
Blaklee JALIAH FOODY is a 20 y.o. G1P0000 at [redacted]w[redacted]d   Notified by Marcelino Duster, RN of change in tracing at [phone], patient repositioned and fluid bolus initiated. Discussed with Dr. Adrian Blackwater. No additional intervention indicated at this time.  Clayton Bibles, MSA, MSN, CNM Certified Nurse Midwife, Biochemist, clinical for [employer], [facility]

## 2021-10-25 NOTE — Progress Notes (Signed)
ARIANAH TORGESON MRN: [id number]  Subjective: -Nurse calls reports patient cervix ~1cm.  Provider to bedside.  Patient now with perception of contractions.    Objective: BP 119/74    Pulse 79    Temp 98.1 F (36.7 C) (Oral)    Resp 18    Ht 5\' 6"  (1.676 m)    Wt 65.3 kg    LMP 01/17/2021 (Exact Date)    BMI 23.23 kg/m  No intake/output data recorded. No intake/output data recorded.  Fetal Monitoring: FHT: 125 bpm, Mod Var, -Decels, +Accels UC: Q1-28min    Vaginal Exam: SVE:   Dilation: 1 Effacement (%): 30 Station: -3 Exam by:: K.Louis-Charles, RN Membranes:Intact Internal Monitors: None  Augmentation/Induction: Pitocin:None Cytotec: S/P 2nd Dose Foley Bulb-Cooks Catheter Inserted w/o difficulty. Instilled with 58mL/60mL fluid.  Assessment:  IUP at 40.1 weeks Cat I FT  IOL Bishop Score:3  Plan: -Discussed foley bulb catheter insertion including r/b, placement, associated discomfort, initiation of pitocin, and removal. -Patient without questions. -Foley inserted without difficulty -Patient s/p 2nd Dose of cytotec -Continue other mgmt as ordered   91m, CNM Advanced Practice Provider, Center for [facility] 10/25/2021, 2:55 AM

## 2021-10-25 NOTE — Progress Notes (Signed)
Labor Progress Note Betty Logan is a 20 y.o. G1P0000 at [redacted]w[redacted]d presented for IOL for cHTN  S: Feeling contractions intensely, unsure if she can identify pain vs. pressure. Agreed to cervical exam, found to be complete with OP position. Discussed laboring down with position changes vs. trying a practice push. Decision made to labor down for next 30 minutes- 1hr. Assisted RN with positioning on left lateral recumbent with peanut ball, well-tolerated by FHT. Doula and aunt at bedside supporting patient. No questions or concerns.  O:  BP (!) 128/97    Pulse 88    Temp 98.6 F (37 C) (Oral)    Resp 16    Ht 5\' 6"  (1.676 m)    Wt 65.3 kg    LMP 01/17/2021 (Exact Date)    SpO2 100%    BMI 23.23 kg/m  FHT: FHR 125 bpm, moderate varaibility, + accels, no decels. Adequate UC with some coupling  CVE: Dilation: 10 Dilation Complete Date: 10/25/21 Dilation Complete Time: 2121 Effacement (%): 100 Station: 0, Plus 1 Presentation: Vertex Exam by:: Das   A&P: 20 y.o. G1P0000 [redacted]w[redacted]d  #Labor: Progressing well. Dilation complete at 0/+1 station and LOP. Will alternate peanut ball positioning and throne to assist in better positioning in anticipation of SVD #Pain: Epidural #FWB: Cat 1 #GBS negative # COVID +, asymptomatic (chronic/other problems)  [redacted]w[redacted]d, DO 9:47 PM

## 2021-10-26 ENCOUNTER — Encounter (HOSPITAL_COMMUNITY): Payer: Self-pay | Admitting: Family Medicine

## 2021-10-26 DIAGNOSIS — O9852 Other viral diseases complicating childbirth: Secondary | ICD-10-CM

## 2021-10-26 DIAGNOSIS — O326XX0 Maternal care for compound presentation, not applicable or unspecified: Secondary | ICD-10-CM

## 2021-10-26 DIAGNOSIS — U071 COVID-19: Secondary | ICD-10-CM

## 2021-10-26 DIAGNOSIS — Z3A40 40 weeks gestation of pregnancy: Secondary | ICD-10-CM

## 2021-10-26 DIAGNOSIS — O1092 Unspecified pre-existing hypertension complicating childbirth: Secondary | ICD-10-CM

## 2021-10-26 LAB — CBC
HCT: 32.3 % — ABNORMAL LOW (ref 36.0–46.0)
Hemoglobin: 11.5 g/dL — ABNORMAL LOW (ref 12.0–15.0)
MCH: 32.8 pg (ref 26.0–34.0)
MCHC: 35.6 g/dL (ref 30.0–36.0)
MCV: 92 fL (ref 80.0–100.0)
Platelets: 206 10*3/uL (ref 150–400)
RBC: 3.51 MIL/uL — ABNORMAL LOW (ref 3.87–5.11)
RDW: 12.7 % (ref 11.5–15.5)
WBC: 18.6 10*3/uL — ABNORMAL HIGH (ref 4.0–10.5)
nRBC: 0 % (ref 0.0–0.2)

## 2021-10-26 MED ORDER — MEASLES, MUMPS & RUBELLA VAC IJ SOLR: 0.5000 mL | Freq: Once | INTRAMUSCULAR | Status: DC

## 2021-10-26 MED ORDER — WITCH HAZEL-GLYCERIN EX PADS: 1.0000 "application " | MEDICATED_PAD | CUTANEOUS | Status: DC | PRN

## 2021-10-26 MED ORDER — DIPHENHYDRAMINE HCL 25 MG PO CAPS: 25.0000 mg | ORAL_CAPSULE | Freq: Four times a day (QID) | ORAL | Status: DC | PRN

## 2021-10-26 MED ORDER — ONDANSETRON HCL 4 MG/2ML IJ SOLN: 4.0000 mg | INTRAMUSCULAR | Status: DC | PRN

## 2021-10-26 MED ORDER — MEDROXYPROGESTERONE ACETATE 150 MG/ML IM SUSP
150.0000 mg | INTRAMUSCULAR | Status: AC | PRN
  Administered 2021-10-27: 11:00:00 150 mg via INTRAMUSCULAR
  Filled 2021-10-26: qty 1

## 2021-10-26 MED ORDER — ACETAMINOPHEN 325 MG PO TABS: 650.0000 mg | ORAL_TABLET | ORAL | Status: DC | PRN

## 2021-10-26 MED ORDER — IBUPROFEN 600 MG PO TABS
600.0000 mg | ORAL_TABLET | Freq: Four times a day (QID) | ORAL | Status: DC
  Administered 2021-10-26 – 2021-10-27 (×6): 600 mg via ORAL
  Filled 2021-10-26 (×6): qty 1

## 2021-10-26 MED ORDER — PRENATAL MULTIVITAMIN CH
1.0000 | ORAL_TABLET | Freq: Every day | ORAL | Status: DC
  Administered 2021-10-26 – 2021-10-27 (×2): 1 via ORAL
  Filled 2021-10-26 (×2): qty 1

## 2021-10-26 MED ORDER — DIBUCAINE (PERIANAL) 1 % EX OINT: 1.0000 "application " | TOPICAL_OINTMENT | CUTANEOUS | Status: DC | PRN

## 2021-10-26 MED ORDER — BENZOCAINE-MENTHOL 20-0.5 % EX AERO: 1.0000 "application " | INHALATION_SPRAY | CUTANEOUS | Status: DC | PRN

## 2021-10-26 MED ORDER — SIMETHICONE 80 MG PO CHEW: 80.0000 mg | CHEWABLE_TABLET | ORAL | Status: DC | PRN

## 2021-10-26 MED ORDER — SERTRALINE HCL 25 MG PO TABS
25.0000 mg | ORAL_TABLET | Freq: Every day | ORAL | Status: DC
  Administered 2021-10-26 – 2021-10-27 (×2): 25 mg via ORAL
  Filled 2021-10-26 (×2): qty 1

## 2021-10-26 MED ORDER — COCONUT OIL OIL: 1.0000 "application " | TOPICAL_OIL | Status: DC | PRN

## 2021-10-26 MED ORDER — ONDANSETRON HCL 4 MG PO TABS: 4.0000 mg | ORAL_TABLET | ORAL | Status: DC | PRN

## 2021-10-26 MED ORDER — TETANUS-DIPHTH-ACELL PERTUSSIS 5-2.5-18.5 LF-MCG/0.5 IM SUSY
0.5000 mL | PREFILLED_SYRINGE | Freq: Once | INTRAMUSCULAR | Status: AC
  Administered 2021-10-27: 11:00:00 0.5 mL via INTRAMUSCULAR
  Filled 2021-10-26: qty 0.5

## 2021-10-26 MED ORDER — SENNOSIDES-DOCUSATE SODIUM 8.6-50 MG PO TABS
2.0000 | ORAL_TABLET | Freq: Every day | ORAL | Status: DC
  Administered 2021-10-27: 11:00:00 2 via ORAL
  Filled 2021-10-26: qty 2

## 2021-10-26 NOTE — Social Work (Signed)
MOB was referred for history of depression.   * Referral screened out by Clinical Social Worker because none of the following criteria appear to apply:  ~ History of anxiety/depression during this pregnancy, or of post-partum depression following prior delivery. ~ Diagnosis of anxiety and/or depression within last 3 years. OR * MOB's symptoms currently being treated with medication and/or therapy. CSW reviewed chart and notes MOB is prescribed Zoloft 25mg . MOB has also been made aware of mental health resources.   Please contact the Clinical Social Worker if needs arise, by [facility] request, or if MOB scores greater than 9/yes to question 10 on Edinburgh Postpartum Depression Screen.  01-12-1979, LCSWA Clinical Social Work [facility] [employer]  [phone]

## 2021-10-26 NOTE — Lactation Note (Signed)
This note was copied from a baby's chart. Lactation Consultation Note  Patient Name: Girl Anaja Monts OEHOZ'Y Date: 10/26/2021 Reason for consult: Initial assessment;Primapara;1st time breastfeeding;Term;Breastfeeding assistance;Other (Comment) (Mom + Covid / Isolation precautions maintained / per RN baby's temp down / baby STS/ LC assisted to attempt to latch / no LATCH/ spoon fed drops.) Age:21 hours Mom aware she can call with stronger feeding cues.  Baby STS / Latch score 5   Maternal Data Has patient been taught Hand Expression?: Yes Does the patient have breastfeeding experience prior to this delivery?: No  Feeding Mother's Current Feeding Choice: Breast Milk and Formula  LATCH Score Latch: Too sleepy or reluctant, no latch achieved, no sucking elicited.  Audible Swallowing: None  Type of Nipple: Everted at rest and after stimulation (areola edema / indicating the use of shells and HP)  Comfort (Breast/Nipple): Soft / non-tender  Hold (Positioning): Assistance needed to correctly position infant at breast and maintain latch.  LATCH Score: 5   Lactation Tools Discussed/Used    Interventions Interventions: Breast feeding basics reviewed;Assisted with latch;Skin to skin;Hand express;Breast compression;Reverse pressure;Adjust position;Support pillows;Position options;Education;LC Services brochure  Discharge    Consult Status Consult Status: Follow-up Date: 10/26/21 Follow-up type: In-patient    Matilde Sprang Davidjames Blansett 10/26/2021, 9:07 AM

## 2021-10-26 NOTE — Progress Notes (Signed)
Called for "grape sized clots" x 2 this afternoon. She is now 15 hours postpartum.   Counseled RN to monitor clots. No intervention at this point.

## 2021-10-26 NOTE — Anesthesia Postprocedure Evaluation (Signed)
Anesthesia Post Note  Patient: Betty Logan  Procedure(s) Performed: AN AD HOC LABOR EPIDURAL     Patient location during evaluation: Mother Baby Anesthesia Type: Epidural Level of consciousness: awake Pain management: satisfactory to patient Vital Signs Assessment: post-procedure vital signs reviewed and stable Respiratory status: spontaneous breathing Cardiovascular status: stable Anesthetic complications: no   No notable events documented.  Last Vitals:  Vitals:   10/26/21 0343 10/26/21 0811  BP: 110/76 116/76  Pulse: 83 76  Resp: 18 17  Temp: 36.9 C 36.9 C  SpO2: 100%     Last Pain:  Vitals:   10/26/21 1151  TempSrc:   PainSc: 0-No pain   Pain Goal: Patients Stated Pain Goal: 3 (10/25/21 1905)                 Cephus Shelling

## 2021-10-26 NOTE — Discharge Summary (Addendum)
Postpartum Discharge Summary    Patient Name: Betty Logan DOB: Jul 23, 2001 MRN: [id number]  Date of admission: 10/24/2021 Delivery date:10/26/2021  Delivering provider: Orvis Brill  Date of discharge: 10/27/2021  Admitting diagnosis: Indication for care or intervention in labor or delivery [O75.9] Intrauterine pregnancy: [redacted]w[redacted]d    Secondary diagnosis:  Principal Problem:   Indication for care or intervention in labor or delivery  Additional problems: Covid, Latent TB, Chronic Hypertension    Discharge diagnosis: Term Pregnancy Delivered and CHTN                                              Post partum procedures: None Augmentation: AROM, Pitocin, Cytotec, and IP Foley Complications: None  Hospital course: Induction of Labor With Vaginal Delivery   20y.o. yo G1P0000 at 438w2das admitted to the hospital 10/24/2021 for induction of labor.  Indication for induction:  Chronic hypertension .  Patient had an uncomplicated labor course as follows: Membrane Rupture Time/Date:  ~11PM 10/25/2021   Delivery Method:Vaginal, Spontaneous  Episiotomy: None  Lacerations:  Periurethral  Details of delivery can be found in separate delivery note.  Patient had a routine postpartum course. She had one blood pressure 30 minutes post partum that was 129/91. Otherwise her blood pressures were normotensive. She was not started on medications for her blood pressure but a message was sent to the office for her to be scheduled for a BP check in 1 week. Patient is discharged home 10/27/21.  Newborn Data: Birth date:10/26/2021  Birth time:12:25 AM  Gender:Female  Living status:Living  Apgars:8 ,9  Weight:7 lb 1.4 oz (3.215 kg)   Magnesium Sulfate received: No BMZ received: No Rhophylac:N/A MMR:No T-DaP: Declined Flu: No Transfusion:No  Physical exam  Vitals:   10/26/21 0811 10/26/21 1517 10/26/21 2003 10/27/21 0609  BP: 116/76 114/80 108/64 109/69  Pulse: 76 87 64 80  Resp: 17 17  18 16   Temp: 98.4 F (36.9 C)  98.2 F (36.8 C) 98.1 F (36.7 C)  TempSrc:    Oral  SpO2:  100%  99%  Weight:      Height:       General: alert, cooperative, and no distress Lochia: appropriate Uterine Fundus: firm DVT Evaluation: No evidence of DVT seen on physical exam. No cords or calf tenderness. No significant calf/ankle edema. Labs: Lab Results  Component Value Date   WBC 18.6 (H) 10/26/2021   HGB 11.5 (L) 10/26/2021   HCT 32.3 (L) 10/26/2021   MCV 92.0 10/26/2021   PLT 206 10/26/2021   CMP Latest Ref Rng & Units 10/24/2021  Glucose 70 - 99 mg/dL 84  BUN 6 - 20 mg/dL 7  Creatinine 0.44 - 1.00 mg/dL 0.53  Sodium 135 - 145 mmol/L 135  Potassium 3.5 - 5.1 mmol/L 4.4  Chloride 98 - 111 mmol/L 106  CO2 22 - 32 mmol/L 22  Calcium 8.9 - 10.3 mg/dL 9.1  Total Protein 6.5 - 8.1 g/dL 6.2(L)  Total Bilirubin 0.3 - 1.2 mg/dL 0.6  Alkaline Phos 38 - 126 U/L 132(H)  AST 15 - 41 U/L 20  ALT 0 - 44 U/L 15   Edinburgh Score: Edinburgh Postnatal Depression Scale Screening Tool 10/26/2021  I have been able to laugh and see the funny side of things. 0  I have looked forward with enjoyment to things. 0  I have blamed myself unnecessarily when things went wrong. 0  I have been anxious or worried for no good reason. 0  I have felt scared or panicky for no good reason. 0  Things have been getting on top of me. 0  I have been so unhappy that I have had difficulty sleeping. 0  I have felt sad or miserable. 0  I have been so unhappy that I have been crying. 0  The thought of harming myself has occurred to me. 0  Edinburgh Postnatal Depression Scale Total 0      After visit meds:  Allergies as of 10/27/2021       Reactions   Cherry Swelling   pineapples        Medication List     STOP taking these medications    aspirin EC 81 MG tablet   Blood Pressure Kit Devi   Doxylamine-Pyridoxine 10-10 MG Tbec Commonly known as: Diclegis   ondansetron 4 MG disintegrating  tablet Commonly known as: ZOFRAN-ODT       TAKE these medications    acetaminophen 325 MG tablet Commonly known as: Tylenol Take 2 tablets (650 mg total) by mouth every 6 (six) hours as needed.   ibuprofen 600 MG tablet Commonly known as: ADVIL Take 1 tablet (600 mg total) by mouth every 6 (six) hours.   medroxyPROGESTERone 150 MG/ML injection Commonly known as: DEPO-PROVERA Inject 1 mL (150 mg total) into the muscle every 3 (three) months.   Prenatal Vitamins 28-0.8 MG Tabs Take 1 tablet by mouth daily.   sertraline 25 MG tablet Commonly known as: Zoloft Take 1 tablet (25 mg total) by mouth daily.         Discharge home in stable condition Infant Feeding: Bottle Infant Disposition:home with mother Discharge instruction: per After Visit Summary and Postpartum booklet. Activity: Advance as tolerated. Pelvic rest for 6 weeks.  Diet: routine diet Anticipated Birth Control: Depo Postpartum Appointment:6 weeks Additional Postpartum F/U: Postpartum Depression checkup Future Appointments: Future Appointments  Date Time Provider [place]  11/01/2021  9:15 AM [place] PGY1  GME ATTESTATION:  I saw and evaluated the patient. I agree with the findings and the plan of care as documented in the residents note and have made all necessary edits.  Renard Matter, MD, MPH OB Fellow, [place] [facility] 10/27/2021 6:50 AM

## 2021-10-27 ENCOUNTER — Ambulatory Visit: Payer: Self-pay

## 2021-10-27 MED ORDER — IBUPROFEN 600 MG PO TABS: 600.0000 mg | ORAL_TABLET | Freq: Four times a day (QID) | ORAL | 0 refills | Status: DC

## 2021-10-27 MED ORDER — ACETAMINOPHEN 325 MG PO TABS: 650.0000 mg | ORAL_TABLET | Freq: Four times a day (QID) | ORAL | 0 refills | Status: AC | PRN

## 2021-10-27 MED ORDER — MEDROXYPROGESTERONE ACETATE 150 MG/ML IM SUSP: 150.0000 mg | INTRAMUSCULAR | 0 refills | Status: DC

## 2021-10-27 NOTE — Lactation Note (Signed)
This note was copied from a baby's chart. Lactation Consultation Note  Patient Name: Betty Logan DJSHF'W Date: 10/27/2021 Reason for consult: Follow-up assessment;Mother's request;Difficult latch;Term;Infant weight loss;Breastfeeding assistance Age:21 hours Mom feeding plan pumping and bottle feeding offering EBM first followed by formula. LC provided volume guidelines based on hrs of age since delivery. Mom aware to offer more since infant not latching at the breast.   Mom hand pump using 24 flange states comfortable fit. Mom getting 10-15 ml per pumping session. LC reviewed pump part cleaning, milk storage and pumping frequency to maintain milk supply.   Mom to reach out to [facility] for Lactation support. Mom follow up with Outpatient [facility] services if needed for latch assistance.   Mom states urine and stool output may not be accurate since she was not aware to check for color change on pamper for urine. Mom provided with copies of I and O sheet to chart feedings urine and stool output.   Mom breasts were full but no evidence of engorgement noted.  Mom encouraged to feed based on cues 8-12x 24 hr period. Mom to post pump with manual q 3hrs for 10 min each breast. Mom work on getting an electric pump for home.   All questions answered at the end of the visit.   Maternal Data    Feeding Mother's Current Feeding Choice: Breast Milk and Formula Nipple Type: Slow - flow  LATCH Score                    Lactation Tools Discussed/Used Tools: Pump;Flanges Flange Size: 24 Breast pump type: Manual Pump Education: Setup, frequency, and cleaning;Milk Storage Reason for Pumping: increase stimulation Pumping frequency: every 3 hrs for 10 min each breast  Interventions Interventions: Breast feeding basics reviewed;Expressed milk;Hand pump;DEBP;Education;LC Services brochure;Infant Driven Feeding Algorithm education  Discharge Discharge Education: Engorgement and breast  care;Warning signs for feeding baby;Outpatient recommendation Pump: Manual WIC Program: Yes  Consult Status Consult Status: Complete Date: 10/27/21    Zechariah Bissonnette  Nicholson[place]Springer 10/27/2021, 2:21 PM

## 2021-10-28 LAB — TYPE AND SCREEN
ABO/RH(D): O POS
Antibody Screen: POSITIVE
Donor AG Type: NEGATIVE
Donor AG Type: NEGATIVE
Unit division: 0
Unit division: 0

## 2021-10-28 LAB — BPAM RBC
Blood Product Expiration Date: 202301142359
Blood Product Expiration Date: 202301142359
ISSUE DATE / TIME: 202212181804
Unit Type and Rh: 5100
Unit Type and Rh: 5100

## 2021-11-01 ENCOUNTER — Ambulatory Visit (INDEPENDENT_AMBULATORY_CARE_PROVIDER_SITE_OTHER): Payer: Medicare Other | Admitting: Clinical

## 2021-11-01 ENCOUNTER — Other Ambulatory Visit: Payer: Self-pay

## 2021-11-01 ENCOUNTER — Telehealth: Payer: Self-pay | Admitting: Lactation Services

## 2021-11-01 DIAGNOSIS — F331 Major depressive disorder, recurrent, moderate: Secondary | ICD-10-CM | POA: Diagnosis not present

## 2021-11-01 DIAGNOSIS — Z658 Other specified problems related to psychosocial circumstances: Secondary | ICD-10-CM

## 2021-11-01 NOTE — BH Specialist Note (Signed)
Integrated Behavioral Health via Telemedicine Visit  11/01/2021 Betty Logan [id number]  Number of Integrated Behavioral Health visits: 2 Session Start time: 10:18  Session End time: 10:34 Total time:  16  Referring Provider: Jaynie Collins, MD Patient/Family location: Home [place] Provider location: Center for [facility] at [facility]  All persons participating in visit: [name] and Betty Logan   Types of Service: Individual psychotherapy and Video visit  I connected with Betty Logan and/or Betty Logan's  n/a  via  Telephone or Engineer, civil (consulting)  (Video is Surveyor, mining) and verified that I am speaking with the correct person using two identifiers. Discussed confidentiality: Yes   I discussed the limitations of telemedicine and the availability of in person appointments.  Discussed there is a possibility of technology failure and discussed alternative modes of communication if that failure occurs.  I discussed that engaging in this telemedicine visit, they consent to the provision of behavioral healthcare and the services will be billed under their insurance.  Patient and/or legal guardian expressed understanding and consented to Telemedicine visit: Yes   Presenting Concerns: Patient and/or family reports the following symptoms/concerns: Poor appetite and a vague, unexplainable feeling of "not feeling good"; pt attributes to possibly long-term effects of covid. Pt has concerns about birth control possibly being expired, unable to reach transportation services to get to her medical appointments. Sleep has improved. Duration of problem: Postpartum; Severity of problem: mild  Patient and/or Family's Strengths/Protective Factors: Social connections, Concrete supports in place (healthy food, safe environments, etc.), and Sense of purpose  Goals Addressed: Patient will:  Reduce symptoms of:  anxiety, depression, and stress   Increase knowledge and/or ability of: healthy habits   Demonstrate ability to: Increase healthy adjustment to current life circumstances  Progress towards Goals: Ongoing  Interventions: Interventions utilized:  Solution-Focused Strategies Standardized Assessments completed: GAD-7 and PHQ 9  Patient and/or Family Response: Pt agrees with treatment plan  Assessment: Patient currently experiencing MDD, recurrent, unspecified remission  Patient may benefit from continued brief therapeutic interventions today.  Plan: Follow up with behavioral health clinician on : Call Betty Logan at [phone], as needed Behavioral recommendations:  -Continue prioritizing healthy sleep until at least postpartum medical visit -[facility] will call to set up appointments; call if you don't hear from them by end of this week; discuss birth control concerns with medical provider -Consider eating at least every 5 hours (even a few bites) to help reduce irritability daily Referral(s): Integrated [employer] (In Clinic)  I discussed the assessment and treatment plan with the patient and/or parent/guardian. They were provided an opportunity to ask questions and all were answered. They agreed with the plan and demonstrated an understanding of the instructions.   They were advised to call back or seek an in-person evaluation if the symptoms worsen or if the condition fails to improve as anticipated.  Rae Lips, LCSW  Depression screen [facility] 2/9 11/15/2021 10/06/2021 08/04/2021 03/23/2021 11/25/2018  Decreased Interest 0 2 0 0 0  Down, Depressed, Hopeless 0 2 1 0 3  PHQ - 2 Score 0 4 1 0 3  Altered sleeping 1 3 3  0 3  Tired, decreased energy 0 3 0 0 0  Change in appetite 1 2 0 1 0  Feeling bad or failure about yourself  0 0 1 0 0  Trouble concentrating 0 0 3 0 3  Moving slowly or fidgety/restless 0 0 0 0 0  Suicidal thoughts 0 0 0 0 0  PHQ-9 Score 2 12 8 1 9    Difficult doing work/chores - Very difficult - - Very difficult   GAD 7 : Generalized Anxiety Score 11/15/2021 08/04/2021 03/23/2021  Nervous, Anxious, on Edge 0 0 0  Control/stop worrying 0 0 0  Worry too much - different things 1 0 0  Trouble relaxing 0 0 0  Restless 0 0 0  Easily annoyed or irritable 3 3 3   Afraid - awful might happen 0 0 0  Total GAD 7 Score 4 3 3

## 2021-11-01 NOTE — Patient Instructions (Signed)
Center for [facility] Healthcare at [facility] for [facility] [address] [place], [place] [address] [phone] (main office) [phone] (Aneshia Jacquet's office)  New mom support groups at:  [email or web address].net  [email or web address]   [place] Copy  (Childcare options, Early childcare development, etc.) [email or web address]

## 2021-11-01 NOTE — Telephone Encounter (Signed)
Called patient at request of Hulda Marin, [facility].   Patient reports infant will not latch routinely to the breast and when she does only for a few minutes.   She is pumping 1-2 times a day and gets 4-5 ounces. She has a manual pump that she is using # 24 flanges. She reports pumping is painful.   Reviewed supply and demand with patient and importance of emptying the breast regularly to promote and protect milk supply.   OP Lactation Appointment offered, patient would like to come in later in the week. Appointment date, time and location given.   Patient advised to bring infant hungry, EBM if available and pump. Reviewed she can bring support person if she would like.   Patient voiced understanding. OP Appt scheduled under infant's name.

## 2021-11-03 ENCOUNTER — Ambulatory Visit: Payer: Medicare Other

## 2021-11-08 ENCOUNTER — Telehealth (HOSPITAL_COMMUNITY): Payer: Self-pay

## 2021-11-08 NOTE — Telephone Encounter (Signed)
"  Feeling fine." Patient has no questions or concerns about her healing.  "I'm at her appointment now. She's doing good. Gaining weight and eating well. She is learning to latch on. I'm doing a combination of breastfeeding and bottle feeding. She sleeps in a bassinet."  RN reviewed ABC's of safe sleep with patient. Patient declines any questions or concerns about baby.  EPDS score is 6.  Marcelino Duster [facility] 11/08/2020,1435

## 2021-11-09 ENCOUNTER — Ambulatory Visit: Payer: Medicare Other

## 2021-11-15 ENCOUNTER — Ambulatory Visit (INDEPENDENT_AMBULATORY_CARE_PROVIDER_SITE_OTHER): Payer: Commercial Managed Care - HMO | Admitting: Clinical

## 2021-11-15 DIAGNOSIS — F339 Major depressive disorder, recurrent, unspecified: Secondary | ICD-10-CM | POA: Diagnosis not present

## 2021-11-17 ENCOUNTER — Ambulatory Visit (INDEPENDENT_AMBULATORY_CARE_PROVIDER_SITE_OTHER): Payer: Medicaid Other | Admitting: *Deleted

## 2021-11-17 ENCOUNTER — Other Ambulatory Visit: Payer: Self-pay

## 2021-11-17 VITALS — BP 108/75 | HR 97

## 2021-11-17 DIAGNOSIS — O10919 Unspecified pre-existing hypertension complicating pregnancy, unspecified trimester: Secondary | ICD-10-CM

## 2021-11-17 NOTE — Progress Notes (Signed)
Subjective:  Betty Logan is a 21 y.o. adult here for BP check. Not on BP medication.  Hypertension ROS: no TIA's, no chest pain on exertion, no dyspnea on exertion, and no swelling of ankles.    Objective:  LMP 01/17/2021 (Exact Date)   Appearance alert, well appearing, and in no distress, oriented to person, place, and time, and normal appearing weight. General exam BP noted to be well controlled today in office.    Assessment:   Blood Pressure well controlled.   Plan:  Current treatment plan is effective, no change in therapy. To return for regularly scheduled postpartum visit on 12/05/21.

## 2021-12-05 ENCOUNTER — Ambulatory Visit: Payer: Medicare Other | Admitting: Obstetrics and Gynecology

## 2021-12-05 ENCOUNTER — Telehealth: Payer: Self-pay

## 2021-12-05 NOTE — Telephone Encounter (Signed)
Left VMM to call Office.

## 2022-09-03 ENCOUNTER — Inpatient Hospital Stay (HOSPITAL_COMMUNITY)
Admission: AD | Admit: 2022-09-03 | Discharge: 2022-09-03 | Disposition: A | Discharge status: Home / Self Care | Payer: Medicare Other | Attending: Obstetrics and Gynecology | Admitting: Obstetrics and Gynecology

## 2022-09-03 DIAGNOSIS — Z711 Person with feared health complaint in whom no diagnosis is made: Secondary | ICD-10-CM

## 2022-09-03 DIAGNOSIS — R102 Pelvic and perineal pain: Secondary | ICD-10-CM | POA: Diagnosis not present

## 2022-09-03 DIAGNOSIS — Z331 Pregnant state, incidental: Secondary | ICD-10-CM | POA: Diagnosis present

## 2022-09-03 DIAGNOSIS — Z3200 Encounter for pregnancy test, result unknown: Secondary | ICD-10-CM | POA: Diagnosis not present

## 2022-09-03 NOTE — MAU Note (Signed)
Pt reports to mau for confirmation of pregnancy.  Pt states she had pos hpt yesterday and would like to confirm it here and dating.  Unsure of LMP.  Denies vag bleeding or pain today.

## 2022-09-03 NOTE — MAU Provider Note (Signed)
Event Date/Time   First Provider Initiated Contact with Patient 09/03/22 1213      S Betty Logan is a 21 y.o. G1P1001 patient who presents to MAU today with no complaints. She desires a confirmation of pregnancy.   O BP (!) 142/85 (BP Location: Right Arm)   Pulse 92   Temp 98.4 F (36.9 C) (Oral)   Resp 16   Ht 5\' 6"  (1.676 m)   Wt 55.1 kg   LMP  (LMP Unknown)   SpO2 98%   BMI 19.61 kg/m  Physical Exam Vitals and nursing note reviewed.  Constitutional:      General: She is not in acute distress.    Appearance: Normal appearance.  HENT:     Head: Normocephalic.  Pulmonary:     Effort: Pulmonary effort is normal.  Musculoskeletal:     Cervical back: Normal range of motion.  Skin:    General: Skin is warm and dry.     Capillary Refill: Capillary refill takes less than 2 seconds.  Neurological:     Mental Status: She is alert and oriented to person, place, and time.  Psychiatric:        Mood and Affect: Mood normal.     A Medical screening exam complete Patient clinically stable   P 1. Physically well but worried    - Discussed that MAU operates like an emergency room and a more suitable facility for confirmation of pregnancy would be at any of the [facility]- offices.  - Discharge from MAU in stable condition - Patient given the option of a outpatient UPT for further evaluation or seek care in outpatient facility of choice  - List of options for follow-up given  - Warning signs for worsening condition that would warrant emergency follow-up discussed - Patient may return to MAU as needed   [name], [place] 09/03/2022 12:14 PM

## 2022-09-04 ENCOUNTER — Telehealth: Payer: Self-pay | Admitting: Advanced Practice Midwife

## 2022-09-04 ENCOUNTER — Inpatient Hospital Stay (HOSPITAL_COMMUNITY)
Admission: AD | Admit: 2022-09-04 | Discharge: 2022-09-04 | Disposition: A | Discharge status: Home / Self Care | Payer: Medicare Other | Attending: Family Medicine | Admitting: Family Medicine

## 2022-09-04 DIAGNOSIS — Z3202 Encounter for pregnancy test, result negative: Secondary | ICD-10-CM | POA: Diagnosis present

## 2022-09-04 DIAGNOSIS — R102 Pelvic and perineal pain: Secondary | ICD-10-CM | POA: Insufficient documentation

## 2022-09-04 LAB — POCT PREGNANCY, URINE: Preg Test, Ur: NEGATIVE

## 2022-09-04 LAB — HCG, QUANTITATIVE, PREGNANCY: hCG, Beta Chain, Quant, S: <1 m[IU]/mL (ref <5)

## 2022-09-04 NOTE — MAU Note (Signed)
.  Betty Logan is a 21 y.o. at Unknown here in MAU reporting: had positive HPtT2 days ago. Started cramping last night after her younger child was laying on top of her. Had some spotting this morning when wiping.  Had a baby in December then had depo . Had first Period in March but none since.  LMP: march ? Onset of complaint: last night Pain score: 0 last night was 5-6. Vitals:   09/04/22 0924  BP: 137/84  Pulse: 82  Resp: 18  Temp: 98.3 F (36.8 C)     FHT:na Lab orders placed from triage:  UPT (negative)

## 2022-09-04 NOTE — MAU Provider Note (Signed)
MAU NOTE   S [name] is a 21 y.o. G1P1001 patient who presents to MAU today with complaint of cramping that started yesterday.  She did have a little bit of spotting when she used bathroom this morning.  She had a positive home pregnancy test.  Pregnancy test here was negative.  Her cramping is now resolved.   O BP 137/84   Pulse 82   Temp 98.3 F (36.8 C)   Resp 18   Ht 5\' 6"  (1.676 m)   Wt 55.1 kg   LMP 01/18/2022   BMI 19.61 kg/m  Physical Exam Vitals reviewed.  Constitutional:      Appearance: Normal appearance.  Cardiovascular:     Rate and Rhythm: Normal rate and regular rhythm.  Abdominal:     General: Abdomen is flat.     Palpations: Abdomen is soft.     Tenderness: There is no abdominal tenderness.  Skin:    General: Skin is warm and dry.     Capillary Refill: Capillary refill takes less than 2 seconds.  Neurological:     General: No focal deficit present.     Mental Status: She is alert.  Psychiatric:        Mood and Affect: Mood normal.        Behavior: Behavior normal.        Thought Content: Thought content normal.        Judgment: Judgment normal.     A Medical screening exam complete 1. Pelvic cramping      P Discharge from MAU in stable condition Patient unable to stay for results.  As her pain is currently resolved, will call patient with results.  Return precautions given. Patient may return to MAU as needed   Truett Mainland, DO 09/04/2022 9:34 AM

## 2022-09-04 NOTE — Telephone Encounter (Signed)
Patient called at home. Identity confirmed x 2. Reviewed Quant hCG <1, consistent with negative pregnancy test. Patient verbalizes understanding and denies questions or concerns at end of call.  Mallie Snooks, MSA, MSN, CNM Certified Nurse Midwife, Faculty Practice 09/04/22 11:00 AM

## 2022-09-04 NOTE — MAU Note (Signed)
BHCG drawn due to negative UPT. Pt will go home and provider will call with results.

## 2022-10-06 ENCOUNTER — Ambulatory Visit: Payer: Medicare Other | Admitting: Internal Medicine

## 2022-10-08 ENCOUNTER — Ambulatory Visit (HOSPITAL_COMMUNITY)
Admission: RE | Admit: 2022-10-08 | Discharge: 2022-10-08 | Disposition: A | Discharge status: Home / Self Care | Payer: Medicare Other | Source: Ambulatory Visit | Attending: Emergency Medicine | Admitting: Emergency Medicine

## 2022-10-08 ENCOUNTER — Encounter (HOSPITAL_COMMUNITY): Payer: Self-pay

## 2022-10-08 VITALS — BP 131/87 | HR 85 | Temp 98.5°F | Resp 16

## 2022-10-08 DIAGNOSIS — Z202 Contact with and (suspected) exposure to infections with a predominantly sexual mode of transmission: Secondary | ICD-10-CM

## 2022-10-08 DIAGNOSIS — Z113 Encounter for screening for infections with a predominantly sexual mode of transmission: Secondary | ICD-10-CM | POA: Diagnosis not present

## 2022-10-08 NOTE — Discharge Instructions (Addendum)
We will call you if anything on your swab returns positive.
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
## 2022-10-08 NOTE — ED Notes (Signed)
Patient has not voided

## 2022-10-10 ENCOUNTER — Telehealth (HOSPITAL_COMMUNITY): Payer: Self-pay | Admitting: Emergency Medicine

## 2022-10-10 LAB — CERVICOVAGINAL ANCILLARY ONLY
Bacterial Vaginitis (gardnerella): POSITIVE — AB
Candida Glabrata: NEGATIVE
Candida Vaginitis: POSITIVE — AB
Chlamydia: NEGATIVE
Comment: NEGATIVE
Comment: NEGATIVE
Comment: NEGATIVE
Comment: NEGATIVE
Comment: NEGATIVE
Comment: NORMAL
Neisseria Gonorrhea: NEGATIVE
Trichomonas: NEGATIVE

## 2022-10-10 MED ORDER — FLUCONAZOLE 150 MG PO TABS: 150.0000 mg | ORAL_TABLET | Freq: Once | ORAL | 0 refills | Status: AC

## 2022-10-10 MED ORDER — METRONIDAZOLE 0.75 % VA GEL: 1.0000 | Freq: Every day | VAGINAL | 0 refills | Status: AC

## 2022-11-17 ENCOUNTER — Ambulatory Visit: Payer: Medicare Other | Admitting: Internal Medicine

## 2022-12-11 ENCOUNTER — Ambulatory Visit: Payer: Medicare Other | Admitting: Internal Medicine

## 2023-01-01 ENCOUNTER — Ambulatory Visit: Payer: Medicare Other | Admitting: Internal Medicine

## 2023-01-15 ENCOUNTER — Encounter (HOSPITAL_COMMUNITY): Payer: Self-pay

## 2023-01-15 ENCOUNTER — Ambulatory Visit (HOSPITAL_COMMUNITY)
Admission: EM | Admit: 2023-01-15 | Discharge: 2023-01-15 | Disposition: A | Discharge status: Home / Self Care | Payer: 59 | Attending: Internal Medicine | Admitting: Internal Medicine

## 2023-01-15 DIAGNOSIS — S61411A Laceration without foreign body of right hand, initial encounter: Secondary | ICD-10-CM

## 2023-01-15 MED ORDER — IBUPROFEN 600 MG PO TABS: 600.0000 mg | ORAL_TABLET | Freq: Four times a day (QID) | ORAL | 0 refills | Status: DC | PRN

## 2023-01-15 MED ORDER — LIDOCAINE HCL (PF) 1 % IJ SOLN
INTRAMUSCULAR | Status: AC
  Filled 2023-01-15: qty 30

## 2023-01-15 MED ORDER — BACITRACIN ZINC 500 UNIT/GM EX OINT: 1.0000 | TOPICAL_OINTMENT | Freq: Two times a day (BID) | CUTANEOUS | 0 refills | Status: DC

## 2023-01-15 NOTE — Discharge Instructions (Signed)
Daily wound dressing changes with antibiotic ointment After 72 hours, it is okay for the wound to come into contact with mild soap and water.  Please ensure that you wash all the soap off after the contact with with soap Ibuprofen as needed for pain and/or fever Please return to urgent care in 10 days (01/25/2023) for suture removal If you notice worsening pain, redness, discharge or fever please return to urgent care to be reevaluated.
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
## 2023-01-25 ENCOUNTER — Encounter (HOSPITAL_COMMUNITY): Payer: Self-pay

## 2023-01-25 ENCOUNTER — Ambulatory Visit (HOSPITAL_COMMUNITY)
Admission: EM | Admit: 2023-01-25 | Discharge: 2023-01-25 | Disposition: A | Discharge status: Home / Self Care | Payer: 59

## 2023-01-25 DIAGNOSIS — Z4802 Encounter for removal of sutures: Secondary | ICD-10-CM | POA: Diagnosis not present

## 2023-01-25 DIAGNOSIS — S61411D Laceration without foreign body of right hand, subsequent encounter: Secondary | ICD-10-CM | POA: Diagnosis not present

## 2023-01-25 NOTE — ED Triage Notes (Signed)
Pt presents for suture removal in right forehand. Remove a total of four sutures. Pt tolerated well.

## 2023-02-22 ENCOUNTER — Encounter (HOSPITAL_COMMUNITY): Payer: Self-pay

## 2023-02-22 ENCOUNTER — Ambulatory Visit (HOSPITAL_COMMUNITY)
Admission: EM | Admit: 2023-02-22 | Discharge: 2023-02-22 | Disposition: A | Discharge status: Home / Self Care | Payer: 59 | Attending: Internal Medicine | Admitting: Internal Medicine

## 2023-02-22 DIAGNOSIS — N3001 Acute cystitis with hematuria: Secondary | ICD-10-CM | POA: Insufficient documentation

## 2023-02-22 LAB — POCT URINALYSIS DIP (MANUAL ENTRY)
Bilirubin, UA: NEGATIVE
Glucose, UA: NEGATIVE mg/dL
Ketones, POC UA: NEGATIVE mg/dL
Nitrite, UA: POSITIVE — AB
Protein Ur, POC: >=300 mg/dL — AB
Spec Grav, UA: >=1.03 — AB (ref 1.010–1.025)
Urobilinogen, UA: 2 E.U./dL — AB
pH, UA: 7 (ref 5.0–8.0)

## 2023-02-22 MED ORDER — CEPHALEXIN 500 MG PO CAPS
500.0000 mg | ORAL_CAPSULE | Freq: Two times a day (BID) | ORAL | 0 refills | Status: AC
Start: 2023-02-22 — End: 2023-03-01
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
## 2023-02-22 NOTE — Discharge Instructions (Signed)
Your urine shows you likely have a urinary tract infection. I have sent your urine for culture to confirm this. We will go ahead and have you start taking antibiotics due to your symptoms.  Take antibiotic as directed.  (Keflex 500mg every 12 hours for 7 days) If you develop diarrhea while taking this medication you may purchase an over-the-counter probiotic or eat yogurt with live active cultures.  To avoid GI upset please take this medication with food. I have sent your urine for culture to see what type of bacteria grows. We will call you if we need to change the treatment plan based on the results of your urine culture.  If you develop any new or worsening symptoms or do not improve in the next 2 to 3 days, please return.  If your symptoms are severe, please go to the emergency room.  Follow-up with your primary care provider for further evaluation and management of your symptoms as well as ongoing wellness visits.  I hope you feel better!  

## 2023-02-22 NOTE — ED Triage Notes (Signed)
Pt presents to the office for Dysuria and legs cramps. Pt would like to check for UTI.

## 2023-02-24 LAB — URINE CULTURE: Culture: 90000 — AB

## 2023-08-04 ENCOUNTER — Other Ambulatory Visit: Payer: Self-pay

## 2023-08-04 ENCOUNTER — Inpatient Hospital Stay (HOSPITAL_COMMUNITY)
Admission: AD | Admit: 2023-08-04 | Discharge: 2023-08-04 | Disposition: A | Discharge status: Home / Self Care | Payer: Medicare Other | Attending: Obstetrics and Gynecology | Admitting: Obstetrics and Gynecology

## 2023-08-04 ENCOUNTER — Encounter: Payer: Self-pay | Admitting: Certified Nurse Midwife

## 2023-08-04 DIAGNOSIS — Z3A01 Less than 8 weeks gestation of pregnancy: Secondary | ICD-10-CM | POA: Insufficient documentation

## 2023-08-04 DIAGNOSIS — Z3201 Encounter for pregnancy test, result positive: Secondary | ICD-10-CM | POA: Insufficient documentation

## 2023-08-04 LAB — POCT PREGNANCY, URINE: Preg Test, Ur: POSITIVE — AB

## 2023-08-04 MED ORDER — PREPLUS 27-1 MG PO TABS: 1.0000 | ORAL_TABLET | Freq: Every day | ORAL | 13 refills | Status: DC

## 2023-08-04 NOTE — MAU Provider Note (Signed)
S Ms. DAJHA URQUILLA is a 22 y.o. G2P1001 pregnant female at [redacted]w[redacted]d who presents to MAU today with no complaints, concerned she is pregnant. Denies vaginal bleeding or cramping.   Receives care at [facility], has not established care for this pregnancy yet.  Pertinent items noted in HPI and remainder of comprehensive ROS otherwise negative.   O BP (!) 133/98 (BP Location: Right Arm)   Pulse (!) 102   Temp 98.5 F (36.9 C) (Oral)   Resp 17   Ht 5\' 6"  (1.676 m)   Wt 115 lb 14.4 oz (52.6 kg)   LMP 07/06/2023 (Exact Date)   BMI 18.71 kg/m  Physical Exam Vitals and nursing note reviewed.  Constitutional:      Appearance: Normal appearance.  Cardiovascular:     Rate and Rhythm: Normal rate and regular rhythm.  Pulmonary:     Effort: Pulmonary effort is normal.  Musculoskeletal:        General: Normal range of motion.  Skin:    General: Skin is warm and dry.     Capillary Refill: Capillary refill takes less than 2 seconds.  Neurological:     Mental Status: She is alert and oriented to person, place, and time.  Psychiatric:        Mood and Affect: Mood normal.        Behavior: Behavior normal.    MDM: Straightforward MAU Course: Informed that since it is a weekend evening, we will confirm her pregnancy but in the future, pregnancy confirmations are only done at [facility]. Pt understanding.   A [redacted] weeks gestation of pregnancy Medical screening exam complete  P Discharge from MAU in stable condition Follow up at [facility] to establish care   Allergies as of 08/04/2023       Reactions   Cherry Swelling   pineapples        Medication List     STOP taking these medications    bacitracin ointment   ibuprofen 600 MG tablet Commonly known as: ADVIL       TAKE these medications    acetaminophen 325 MG tablet Commonly known as: Tylenol Take 2 tablets (650 mg total) by mouth every 6 (six) hours as needed.   PrePLUS 27-1 MG Tabs Take 1 tablet by  mouth daily.       Bernerd Limbo, [place] 08/04/2023 7:20 PM

## 2023-08-04 NOTE — MAU Note (Signed)
Betty Logan is a 22 y.o. at Unknown here in MAU reporting: being 4 days late on period and reports 2 home +UPT  Denies pain. Denies vaginal bleeding.    LMP: 07/06/23 Onset of complaint: today  Pain score: 0 Vitals:   08/04/23 1842  BP: (!) 133/98  Pulse: (!) 102  Resp: 17  Temp: 98.5 F (36.9 C)     FHT: n/a Lab orders placed from triage:  UA

## 2023-08-29 ENCOUNTER — Other Ambulatory Visit: Payer: Self-pay

## 2023-08-29 DIAGNOSIS — O219 Vomiting of pregnancy, unspecified: Secondary | ICD-10-CM

## 2023-08-29 MED ORDER — PROMETHAZINE HCL 25 MG PO TABS: 25.0000 mg | ORAL_TABLET | Freq: Four times a day (QID) | ORAL | 2 refills | Status: DC | PRN

## 2023-09-03 ENCOUNTER — Other Ambulatory Visit (INDEPENDENT_AMBULATORY_CARE_PROVIDER_SITE_OTHER): Payer: Self-pay

## 2023-09-03 ENCOUNTER — Ambulatory Visit (INDEPENDENT_AMBULATORY_CARE_PROVIDER_SITE_OTHER): Payer: Medicare Other | Admitting: *Deleted

## 2023-09-03 ENCOUNTER — Other Ambulatory Visit (HOSPITAL_COMMUNITY)
Admission: RE | Admit: 2023-09-03 | Discharge: 2023-09-03 | Disposition: A | Discharge status: Home / Self Care | Payer: Medicare Other | Source: Ambulatory Visit | Attending: Obstetrics and Gynecology | Admitting: Obstetrics and Gynecology

## 2023-09-03 VITALS — BP 119/83 | HR 82 | Wt 115.0 lb

## 2023-09-03 DIAGNOSIS — Z3480 Encounter for supervision of other normal pregnancy, unspecified trimester: Secondary | ICD-10-CM | POA: Diagnosis present

## 2023-09-03 DIAGNOSIS — Z1339 Encounter for screening examination for other mental health and behavioral disorders: Secondary | ICD-10-CM

## 2023-09-03 DIAGNOSIS — Z3481 Encounter for supervision of other normal pregnancy, first trimester: Secondary | ICD-10-CM

## 2023-09-03 DIAGNOSIS — Z3A08 8 weeks gestation of pregnancy: Secondary | ICD-10-CM | POA: Diagnosis not present

## 2023-09-03 DIAGNOSIS — O3680X0 Pregnancy with inconclusive fetal viability, not applicable or unspecified: Secondary | ICD-10-CM

## 2023-09-03 HISTORY — DX: Encounter for supervision of other normal pregnancy, unspecified trimester: Z34.80

## 2023-09-03 MED ORDER — BLOOD PRESSURE KIT DEVI: 1.0000 | 0 refills | Status: DC

## 2023-09-03 MED ORDER — DOXYLAMINE-PYRIDOXINE 10-10 MG PO TBEC: 2.0000 | DELAYED_RELEASE_TABLET | Freq: Every day | ORAL | 5 refills | Status: DC

## 2023-09-03 NOTE — Progress Notes (Signed)
New OB Intake  I connected with Betty Logan  on 09/03/23 at 10:15 AM EDT by In Person Visit and verified that I am speaking with the correct person using two identifiers. Nurse is located at [facility] and pt is located at [place].  I discussed the limitations, risks, security and privacy concerns of performing an evaluation and management service by telephone and the availability of in person appointments. I also discussed with the patient that there may be a patient responsible charge related to this service. The patient expressed understanding and agreed to proceed.  I explained I am completing New OB Intake today. We discussed EDD of 04/11/2024, by Last Menstrual Period. Pt is G2P1001. I reviewed her allergies, medications and Medical/Surgical/OB history.    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. Discussed the nature of our practice with multiple providers including residents and students. Due to the size of the practice, the delivering provider may not be the same as those providing prenatal care.   Patient is interested in water birth. Offered upcoming OB visit with CNM to discuss further.  MyChart/Babyscripts MyChart access verified. I explained pt will have some visits in office and some virtually. Babyscripts instructions given and order  placed. Patient verifies receipt of registration text/e-mail. Account successfully created and app downloaded.  Blood Pressure Cuff/Weight Scale Blood pressure cuff ordered for patient to pick-up from [employer]. Explained after first prenatal appt pt will check weekly and document in Babyscripts. Patient does not have weight scale; patient may purchase if they desire to track weight weekly in Babyscripts.  Anatomy US Explained first scheduled [place] will be around 19 weeks. Anatomy US scheduled for TBD at TBD.  Interested in [place]? If yes, send referral and doula dot phrase.   Is patient a candidate for Babyscripts Optimization? No - CHTN  First visit review I reviewed new OB appt with patient. Explained pt will be seen by Dr. Wylene Simmer at first visit. Discussed Avelina Laine genetic screening with patient. Requests Panorama. Routine prenatal labs  OB Urine, GC/CC collected at today's visit.    Last Pap No results found for: "DIAGPAP"  Harrel Lemon, RN 09/03/2023  10:14 AM

## 2023-09-03 NOTE — Patient Instructions (Signed)

## 2023-09-05 LAB — URINE CULTURE, OB REFLEX

## 2023-09-05 LAB — CULTURE, OB URINE

## 2023-09-06 LAB — CERVICOVAGINAL ANCILLARY ONLY
Chlamydia: NEGATIVE
Comment: NEGATIVE
Comment: NORMAL
Neisseria Gonorrhea: NEGATIVE

## 2023-09-18 NOTE — Progress Notes (Unsigned)
History:   Betty Logan is a 22 y.o. G2P1001 at [redacted]w[redacted]d by {Ob dating:14516} being seen today for her first obstetrical visit.  Her obstetrical history is significant for {ob risk factors:10154}. Patient {does/does not:19097} intend to breast feed. Pregnancy history fully reviewed.  Patient reports {sx:14538}.      HISTORY: OB History  Gravida Para Term Preterm AB Living  2 1 1  0 0 1  SAB IAB Ectopic Multiple Live Births  0 0 0 0 1    # Outcome Date GA Lbr Len/2nd Weight Sex Type Anes PTL Lv  2 Current           1 Term 10/26/21 [redacted]w[redacted]d / 03:04 7 lb 1.4 oz (3.215 kg) F Vag-Spont EPI  LIV     Name: Correll,GIRL Mita     Apgar1: 8  Apgar5: 9    Last pap smear was done *** and was {Normal/Abnormal Appearance:21344::"normal"}  Past Medical History:  Diagnosis Date   Complication of anesthesia    Deliberate medication overdose (HCC)    Depression    Hypertension    Past Surgical History:  Procedure Laterality Date   EYE SURGERY     Family History  Problem Relation Age of Onset   Hypertension Mother    Seizures Father    Social History   Tobacco Use   Smoking status: Former   Smokeless tobacco: Never  Vaping Use   Vaping status: Never Used  Substance Use Topics   Alcohol use: Not Currently    Comment: rarely   Drug use: Not Currently    Types: Marijuana   Allergies  Allergen Reactions   Cherry Swelling    pineapples   Current Outpatient Medications on File Prior to Visit  Medication Sig Dispense Refill   acetaminophen (TYLENOL) 325 MG tablet Take 2 tablets (650 mg total) by mouth every 6 (six) hours as needed. 30 tablet 0   Blood Pressure Monitoring (BLOOD PRESSURE KIT) DEVI 1 Device by Does not apply route once a week. 1 each 0   Doxylamine-Pyridoxine (DICLEGIS) 10-10 MG TBEC Take 2 tablets by mouth at bedtime. If symptoms persist, add one tablet in the morning and one in the afternoon 100 tablet 5   Prenatal Vit-Fe Fumarate-FA (PREPLUS) 27-1 MG TABS Take  1 tablet by mouth daily. 30 tablet 13   promethazine (PHENERGAN) 25 MG tablet Take 1 tablet (25 mg total) by mouth every 6 (six) hours as needed for nausea or vomiting. (Patient not taking: Reported on 09/03/2023) 30 tablet 2   [DISCONTINUED] albuterol (VENTOLIN HFA) 108 (90 Base) MCG/ACT inhaler Inhale 2 puffs into the lungs every 4 (four) hours as needed for wheezing or shortness of breath. (Patient not taking: No sig reported) 18 g 0   [DISCONTINUED] budesonide-formoterol (SYMBICORT) 80-4.5 MCG/ACT inhaler Inhale 2 puffs into the lungs in the morning and at bedtime. (Patient not taking: Reported on 10/14/2020) 1 Inhaler 12   [DISCONTINUED] cetirizine (ZYRTEC) 10 MG tablet Take 1 tablet (10 mg total) by mouth daily. (Patient not taking: Reported on 10/14/2020) 90 tablet 2   [DISCONTINUED] traZODone (DESYREL) 50 MG tablet Take 1 tablet (50 mg total) by mouth at bedtime as needed for sleep. (Patient not taking: Reported on 05/27/2020) 90 tablet 3   No current facility-administered medications on file prior to visit.    Review of Systems Pertinent items noted in HPI and remainder of comprehensive ROS otherwise negative.  Indications for ASA therapy (per UpToDate) One of the following: Previous pregnancy  with preeclampsia, especially early onset and with an adverse outcome {yes/no:20286} Multifetal gestation {yes/no:20286} Chronic hypertension {yes/no:20286} Type 1 or 2 diabetes mellitus {yes/no:20286} Chronic kidney disease {yes/no:20286} Autoimmune disease (antiphospholipid syndrome, systemic lupus erythematosus) {yes/no:20286} Two or more of the following: Nulliparity {yes/no:20286} Obesity (body mass index >30 kg/m2) {yes/no:20286} Family history of preeclampsia in mother or sister {yes/no:20286} Age >=35 years {yes/no:20286} Sociodemographic characteristics (African American race, low socioeconomic level) {yes/no:20286} Personal risk factors (eg, previous pregnancy with low birth weight or  small for gestational age infant, previous adverse pregnancy outcome [eg, stillbirth], interval >10 years between pregnancies) {yes/no:20286}  Indications for early GDM screening (FBS, A1C, Random CBG, GTT) First-degree relative with diabetes {yes/no:20286} BMI >30kg/m2 {yes/no:20286} Age > 35 {yes/no:20286} Previous birth of an infant weighing >=4000 g {yes/no:20286} Gestational diabetes mellitus in a previous pregnancy {yes/no:20286} Glycated hemoglobin >=5.7 percent (39 mmol/mol), impaired glucose tolerance, or impaired fasting glucose on previous testing {yes/no:20286} High-risk race/ethnicity (eg, African American, Latino, Native American, [place] American, [place] Islander) {yes/no:20286} Previous stillbirth of unknown cause {yes/no:20286} Maternal birthweight > 9 lbs {yes/no:20286} History of cardiovascular disease {yes/no:20286} Hypertension or on therapy for hypertension {yes/no:20286} High-density lipoprotein cholesterol level <35 mg/dL (1.61 mmol/L) and/or a triglyceride level >250 mg/dL (0.96 mmol/L) {EAV/WU:98119} Polycystic ovary syndrome {yes/no:20286} Physical inactivity {yes/no:20286} Other clinical condition associated with insulin resistance (eg, severe obesity, acanthosis nigricans) {yes/no:20286} Current use of glucocorticoids {yes/no:20286}   Physical Exam:  There were no vitals filed for this visit.    Uterine size:   ***Bedside Ultrasound for FHR check: Viable intrauterine pregnancy with positive cardiac activity noted, fetal heart rate ***bpm Patient informed that the ultrasound is considered a limited obstetric ultrasound and is not intended to be a complete ultrasound exam.  Patient also informed that the ultrasound is not being completed with the intent of assessing for fetal or placental anomalies or any pelvic abnormalities.  Explained that the purpose of today's ultrasound is to assess for fetal heart rate.  Patient acknowledges the purpose of the exam and the  limitations of the study. General: well-developed, well-nourished female in no acute distress  Breasts:  normal appearance, no masses or tenderness bilaterally, exam done in the presence of a chaperone.   Skin: normal coloration and turgor, no rashes  Neurologic: oriented, normal, negative, normal mood  Extremities: normal strength, tone, and muscle mass, ROM of all joints is normal  HEENT PERRLA, extraocular movement intact and sclera clear, anicteric  Neck supple and no masses  Cardiovascular: regular rate and rhythm  Respiratory:  no respiratory distress, normal breath sounds  Abdomen: soft, non-tender; bowel sounds normal; no masses,  no organomegaly  Pelvic: normal external genitalia, no lesions, normal vaginal mucosa, normal vaginal discharge, normal cervix, ***pap smear done. Exam done in the presence of a chaperone.     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 Supervision of high  risk pregnancy, antepartum ***  2. [redacted] weeks gestation of pregnancy ***   Initial labs drawn. Continue prenatal vitamins. Problem list reviewed and updated. Genetic Screening discussed, {Blank multiple:19196::"Panorama","Horizon"}: {requests/ordered/declines:14581}. Ultrasound discussed; fetal anatomic survey:  {Planned/scheduled/na:14534}. Anticipatory guidance about prenatal visits given including labs, ultrasounds, and testing. Weight gain recommendations per IOM guidelines reviewed: underweight/BMI 18.5 or less > 28 - 40 lbs; normal weight/BMI 18.5 - 24.9 > 25 - 35 lbs; overweight/BMI 25 - 29.9 > 15 - 25 lbs; obese/BMI  30 or more > 11 - 20 lbs. Discussed usage of the Babyscripts app for more information about pregnancy, and to track blood pressures. Also discussed usage of virtual visits as additional source of managing and completing prenatal visits.  Patient was encouraged to use MyChart to review results, send requests, and have questions addressed.   The nature of [facility]ulty Practice with multiple MDs and Advanced Practice Providers was explained to patient; also emphasized that residents, students are part of our team. Routine obstetric precautions reviewed. Encouraged to seek out care at our office or emergency room [facility] MAU preferred) for urgent and/or emergent concerns. No follow-ups on file.    Lamont Snowball, MSN, CNM, RNC-OB Certified Nurse Midwife, [facility] 09/18/2023 3:08 PM

## 2023-09-19 ENCOUNTER — Other Ambulatory Visit (HOSPITAL_COMMUNITY)
Admission: RE | Admit: 2023-09-19 | Discharge: 2023-09-19 | Disposition: A | Discharge status: Home / Self Care | Payer: Medicare Other | Source: Ambulatory Visit | Attending: Obstetrics and Gynecology | Admitting: Obstetrics and Gynecology

## 2023-09-19 ENCOUNTER — Ambulatory Visit (INDEPENDENT_AMBULATORY_CARE_PROVIDER_SITE_OTHER): Payer: Medicare Other | Admitting: Certified Nurse Midwife

## 2023-09-19 ENCOUNTER — Other Ambulatory Visit: Payer: Self-pay

## 2023-09-19 ENCOUNTER — Encounter: Payer: Self-pay | Admitting: Certified Nurse Midwife

## 2023-09-19 VITALS — BP 128/80 | HR 76 | Wt 116.5 lb

## 2023-09-19 DIAGNOSIS — Z3A10 10 weeks gestation of pregnancy: Secondary | ICD-10-CM | POA: Diagnosis not present

## 2023-09-19 DIAGNOSIS — Z3481 Encounter for supervision of other normal pregnancy, first trimester: Secondary | ICD-10-CM | POA: Diagnosis not present

## 2023-09-19 DIAGNOSIS — Z124 Encounter for screening for malignant neoplasm of cervix: Secondary | ICD-10-CM | POA: Diagnosis present

## 2023-09-19 DIAGNOSIS — O0991 Supervision of high risk pregnancy, unspecified, first trimester: Secondary | ICD-10-CM

## 2023-09-19 DIAGNOSIS — O0990 Supervision of high risk pregnancy, unspecified, unspecified trimester: Secondary | ICD-10-CM

## 2023-09-19 DIAGNOSIS — O219 Vomiting of pregnancy, unspecified: Secondary | ICD-10-CM

## 2023-09-19 DIAGNOSIS — Z3480 Encounter for supervision of other normal pregnancy, unspecified trimester: Secondary | ICD-10-CM

## 2023-09-19 MED ORDER — SCOPOLAMINE 1 MG/3DAYS TD PT72: 1.0000 | MEDICATED_PATCH | TRANSDERMAL | 0 refills | Status: DC

## 2023-09-19 MED ORDER — METOCLOPRAMIDE HCL 10 MG PO TABS: 10.0000 mg | ORAL_TABLET | Freq: Four times a day (QID) | ORAL | 2 refills | Status: DC

## 2023-09-19 MED ORDER — ASPIRIN 81 MG PO TBEC: 162.0000 mg | DELAYED_RELEASE_TABLET | Freq: Every day | ORAL | 2 refills | Status: DC

## 2023-09-19 MED ORDER — ONDANSETRON HCL 4 MG PO TABS: 4.0000 mg | ORAL_TABLET | Freq: Three times a day (TID) | ORAL | 2 refills | Status: DC | PRN

## 2023-09-19 NOTE — Patient Instructions (Addendum)
Our practice his participating in a study that provides no-cost doula care. ACURE4Moms is a study looking at how doula care can reduce birthing disparities for Black and brown birthing people. We like to refer patients as soon as possible, but definitely before 28 weeks so patients can get to know their doula.    A doula is trained to provide support before, during and just after you give birth. While doulas do not provide medical care, they do provide emotional, physical and educational support. Doulas can help reduce your stress and comfort you and your partner. They can help you cope with labor by helping you use breathing techniques, massage, creative labor positioning, essential oils and affirmations.   ACURE4Moms is a research study trying to reduce:   low birthweight babies  emergency department visits & hospitalizations for birthing persons and their babies  depression among birthing people  discrimination in pregnancy-related care ACURE4Moms is trying out 2 programs designed by  people who have given birth. These programs include: 1. Sharing patient data and warning alerts with clinic staff to keep them accountable for their patients' outcomes and providing tools to help them  reduce bias in care. 2. Matching eligible patients with doulas from the  same community as the patients.  If you would like to participate in this study, please visit:   [email or web address]   Considering Waterbirth? Guide for patients at [facility]) Why consider waterbirth? Gentle birth for babies  Less pain medicine used in labor  May allow for passive descent/less pushing  May reduce perineal tears  More mobility and instinctive maternal position changes  Increased maternal relaxation   Is waterbirth safe? What are the risks of infection, drowning or other complications? Infection:  Very low risk (3.7 % for tub vs 4.8% for bed)  7 in 8000 waterbirths with  documented infection  Poorly cleaned equipment most common cause  Slightly lower group B strep transmission rate  Drowning  Maternal:  Very low risk  Related to seizures or fainting  Newborn:  Very low risk. No evidence of increased risk of respiratory problems in multiple large studies  Physiological protection from breathing under water  Avoid underwater birth if there are any fetal complications  Once baby's head is out of the water, keep it out.  Birth complication  Some reports of cord trauma, but risk decreased by bringing baby to surface gradually  No evidence of increased risk of shoulder dystocia. Mothers can usually change positions faster in water than in a bed, possibly aiding the maneuvers to free the shoulder.   There are 2 things you MUST do to have a waterbirth with [facility]: Attend a waterbirth class at [facility]   3rd Wednesday of every month from 7-9 pm (virtual during COVID) [employer] at [email or web address] or [email or web address] or by calling [phone] Bring [place] the certificate from the class to your prenatal appointment or send via MyChart Meet with a midwife at 36 weeks* to see if you can still plan a waterbirth and to sign the consent.   *We also recommend that you schedule as many of your prenatal visits with a midwife as possible.    Helpful information: You may want to bring a bathing suit top to the hospital to wear during labor but this is optional.  All other supplies are provided by the hospital. Please arrive at the hospital with signs of active labor, and do not wait at home until late  in labor. It takes 45 min- 1 hour for fetal monitoring, and check in to your room to take place, plus transport and filling of the waterbirth tub.    Things that would prevent you from having a waterbirth: Premature, <37wks  Previous cesarean birth  Presence of thick meconium-stained fluid  Multiple gestation (Twins,  triplets, etc.)  Uncontrolled diabetes or gestational diabetes requiring medication  Hypertension diagnosed in pregnancy or preexisting hypertension (gestational hypertension, preeclampsia, or chronic hypertension) Fetal growth restriction (your baby measures less than 10th percentile on ultrasound) Heavy vaginal bleeding  Non-reassuring fetal heart rate  Active infection (MRSA, etc.). Group B Strep is NOT a contraindication for waterbirth.  If your labor has to be induced and induction method requires continuous monitoring of the baby's heart rate  Other risks/issues identified by your obstetrical provider   Please remember that birth is unpredictable. Under certain unforeseeable circumstances your provider may advise against giving birth in the tub. These decisions will be made on a case-by-case basis and with the safety of you and your baby as our highest priority.    Updated 02/08/22

## 2023-09-20 ENCOUNTER — Encounter: Payer: Self-pay | Admitting: *Deleted

## 2023-09-20 ENCOUNTER — Encounter: Payer: Self-pay | Admitting: Certified Nurse Midwife

## 2023-09-20 LAB — COMPREHENSIVE METABOLIC PANEL
ALT: 10 [IU]/L (ref 0–32)
AST: 11 [IU]/L (ref 0–40)
Albumin: 4.6 g/dL (ref 4.0–5.0)
Alkaline Phosphatase: 39 [IU]/L — ABNORMAL LOW (ref 44–121)
BUN/Creatinine Ratio: 16 (ref 9–23)
BUN: 8 mg/dL (ref 6–20)
Bilirubin Total: 0.4 mg/dL (ref 0.0–1.2)
CO2: 19 mmol/L — ABNORMAL LOW (ref 20–29)
Calcium: 9.6 mg/dL (ref 8.7–10.2)
Chloride: 102 mmol/L (ref 96–106)
Creatinine, Ser: 0.5 mg/dL — ABNORMAL LOW (ref 0.57–1.00)
Globulin, Total: 2.6 g/dL (ref 1.5–4.5)
Glucose: 80 mg/dL (ref 70–99)
Potassium: 4.3 mmol/L (ref 3.5–5.2)
Sodium: 136 mmol/L (ref 134–144)
Total Protein: 7.2 g/dL (ref 6.0–8.5)
eGFR: 135 mL/min/{1.73_m2} (ref >59)

## 2023-09-20 LAB — CBC/D/PLT+RPR+RH+ABO+RUBIGG...
Antibody Screen: NEGATIVE
Basophils Absolute: 0 10*3/uL (ref 0.0–0.2)
Basos: 0 %
EOS (ABSOLUTE): 0.1 10*3/uL (ref 0.0–0.4)
Eos: 2 %
HCV Ab: NONREACTIVE
HIV Screen 4th Generation wRfx: NONREACTIVE
Hematocrit: 38.9 % (ref 34.0–46.6)
Hemoglobin: 12.9 g/dL (ref 11.1–15.9)
Hepatitis B Surface Ag: NEGATIVE
Immature Grans (Abs): 0 10*3/uL (ref 0.0–0.1)
Immature Granulocytes: 0 %
Lymphocytes Absolute: 2 10*3/uL (ref 0.7–3.1)
Lymphs: 33 %
MCH: 30.4 pg (ref 26.6–33.0)
MCHC: 33.2 g/dL (ref 31.5–35.7)
MCV: 92 fL (ref 79–97)
Monocytes Absolute: 0.5 10*3/uL (ref 0.1–0.9)
Monocytes: 8 %
Neutrophils Absolute: 3.3 10*3/uL (ref 1.4–7.0)
Neutrophils: 57 %
Platelets: 306 10*3/uL (ref 150–450)
RBC: 4.24 x10E6/uL (ref 3.77–5.28)
RDW: 12.1 % (ref 11.7–15.4)
RPR Ser Ql: NONREACTIVE
Rh Factor: POSITIVE
Rubella Antibodies, IGG: <0.9 {index} — ABNORMAL LOW (ref >0.99)
WBC: 5.9 10*3/uL (ref 3.4–10.8)

## 2023-09-20 LAB — HCV INTERPRETATION

## 2023-09-20 LAB — CYTOLOGY - PAP: Diagnosis: NEGATIVE

## 2023-09-20 LAB — PROTEIN / CREATININE RATIO, URINE
Creatinine, Urine: 277.3 mg/dL
Protein, Ur: 38.5 mg/dL
Protein/Creat Ratio: 139 mg/g{creat} (ref 0–200)

## 2023-09-20 LAB — HEMOGLOBIN A1C
Est. average glucose Bld gHb Est-mCnc: 94 mg/dL
Hgb A1c MFr Bld: 4.9 % (ref 4.8–5.6)

## 2023-09-20 LAB — TSH: TSH: 0.935 u[IU]/mL (ref 0.450–4.500)

## 2023-09-25 ENCOUNTER — Encounter: Payer: Self-pay | Admitting: Certified Nurse Midwife

## 2023-09-25 LAB — PANORAMA PRENATAL TEST FULL PANEL:PANORAMA TEST PLUS 5 ADDITIONAL MICRODELETIONS: FETAL FRACTION: 6.8

## 2023-10-01 ENCOUNTER — Encounter: Payer: Self-pay | Admitting: Certified Nurse Midwife

## 2023-10-01 ENCOUNTER — Inpatient Hospital Stay (HOSPITAL_COMMUNITY)
Admission: AD | Admit: 2023-10-01 | Discharge: 2023-10-01 | Disposition: A | Discharge status: Home / Self Care | Payer: Medicare Other | Attending: Obstetrics & Gynecology | Admitting: Obstetrics & Gynecology

## 2023-10-01 ENCOUNTER — Encounter (HOSPITAL_COMMUNITY): Payer: Self-pay | Admitting: Obstetrics & Gynecology

## 2023-10-01 DIAGNOSIS — Z3A12 12 weeks gestation of pregnancy: Secondary | ICD-10-CM | POA: Diagnosis not present

## 2023-10-01 DIAGNOSIS — O10911 Unspecified pre-existing hypertension complicating pregnancy, first trimester: Secondary | ICD-10-CM | POA: Diagnosis not present

## 2023-10-01 DIAGNOSIS — O219 Vomiting of pregnancy, unspecified: Secondary | ICD-10-CM | POA: Diagnosis not present

## 2023-10-01 LAB — URINALYSIS, ROUTINE W REFLEX MICROSCOPIC
Bilirubin Urine: NEGATIVE
Glucose, UA: NEGATIVE mg/dL
Hgb urine dipstick: NEGATIVE
Ketones, ur: 80 mg/dL — AB
Leukocytes,Ua: NEGATIVE
Nitrite: NEGATIVE
Protein, ur: 30 mg/dL — AB
Specific Gravity, Urine: 1.033 — ABNORMAL HIGH (ref 1.005–1.030)
pH: 5 (ref 5.0–8.0)

## 2023-10-01 LAB — COMPREHENSIVE METABOLIC PANEL
ALT: 19 U/L (ref 0–44)
AST: 17 U/L (ref 15–41)
Albumin: 4 g/dL (ref 3.5–5.0)
Alkaline Phosphatase: 31 U/L — ABNORMAL LOW (ref 38–126)
Anion gap: 9 (ref 5–15)
BUN: 12 mg/dL (ref 6–20)
CO2: 22 mmol/L (ref 22–32)
Calcium: 9.3 mg/dL (ref 8.9–10.3)
Chloride: 101 mmol/L (ref 98–111)
Creatinine, Ser: 0.54 mg/dL (ref 0.44–1.00)
GFR, Estimated: >60 mL/min (ref >60)
Glucose, Bld: 86 mg/dL (ref 70–99)
Potassium: 3.5 mmol/L (ref 3.5–5.1)
Sodium: 132 mmol/L — ABNORMAL LOW (ref 135–145)
Total Bilirubin: 1 mg/dL (ref <1.2)
Total Protein: 7.7 g/dL (ref 6.5–8.1)

## 2023-10-01 MED ORDER — ONDANSETRON 4 MG PO TBDP
4.0000 mg | ORAL_TABLET | Freq: Once | ORAL | Status: AC
  Administered 2023-10-01: 4 mg via ORAL
  Filled 2023-10-01: qty 1

## 2023-10-01 NOTE — MAU Provider Note (Signed)
History     CSN: [id number]  Arrival date and time: 10/01/23 1309   None     Chief Complaint  Patient presents with   Abdominal Pain   Nausea   Emesis   HPI  22 yo g2p1 @ 12+3 by L/8, chtn, presenting with nausea/vomiting.  Has had this for several weeks, was prescribed several meds by her OB provider but hasn't yet picked those up. Able to tolerate liquids and is urinating with normal frequency but trouble keeping food down. No abd pain, no bleeding. No dysuria or fevers.   Past Medical History:  Diagnosis Date   Complication of anesthesia    Deliberate medication overdose (HCC)    Depression    Hypertension    Nausea and vomiting in pregnancy 03/30/2021   RX Diclegis 03/30/2021     Nightmares 10/08/2014   Supervision of high risk pregnancy, antepartum 03/23/2021              Nursing Staff    Provider      Office Location     [facility]    Dating     LMP      Language     English    Anatomy US     Normal but incomplete, f/u end of August 2022      Flu Vaccine     Received 07/08/21    Genetic/Carrier Screen     NIPS: Low risk female  AFP:  Not done  Horizon: neg      TDaP Vaccine      Declined 08/04/21    Hgb A1C or   GTT    Early   Third trimester       COVI   Supervision of other normal pregnancy, antepartum 09/03/2023              NURSING     PROVIDER      Office Location    [facility]    Dating by    LMP c/w U/S at 8 wks      [facility] Model    Traditional    Anatomy U/S           Initiated care at     [facility]     English                     LAB RESULTS       Support Person         Genetics    NIPS:   AFP:                 NT/IT (FT only)                     Carrier Screen    Horizon:           Past Surgical History:  Procedure Laterality Date   EYE SURGERY      Family History  Problem Relation Age of Onset   Hypertension Mother    Seizures Father     Social History   Tobacco Use   Smoking status: Former    Types: Cigarettes   Smokeless  tobacco: Never   Tobacco comments:    "As a kid", stopped when a teenage  Vaping Use   Vaping status: Never Used  Substance Use Topics  Alcohol use: Not Currently    Comment: rarely   Drug use: Not Currently    Types: Marijuana    Comment: "been a while"    Allergies:  Allergies  Allergen Reactions   Cherry Swelling    pineapples    Medications Prior to Admission  Medication Sig Dispense Refill Last Dose   acetaminophen (TYLENOL) 325 MG tablet Take 2 tablets (650 mg total) by mouth every 6 (six) hours as needed. 30 tablet 0 More than a month   aspirin EC 81 MG tablet Take 2 tablets (162 mg total) by mouth daily. Take after 12 weeks for prevention of preeclampsia later in pregnancy 300 tablet 2    Blood Pressure Monitoring (BLOOD PRESSURE KIT) DEVI 1 Device by Does not apply route once a week. (Patient not taking: Reported on 09/19/2023) 1 each 0    Doxylamine-Pyridoxine (DICLEGIS) 10-10 MG TBEC Take 2 tablets by mouth at bedtime. If symptoms persist, add one tablet in the morning and one in the afternoon 100 tablet 5    metoCLOPramide (REGLAN) 10 MG tablet Take 1 tablet (10 mg total) by mouth 4 (four) times daily. 120 tablet 2    ondansetron (ZOFRAN) 4 MG tablet Take 1 tablet (4 mg total) by mouth every 8 (eight) hours as needed for nausea or vomiting. 20 tablet 2    Prenatal Vit-Fe Fumarate-FA (PREPLUS) 27-1 MG TABS Take 1 tablet by mouth daily. 30 tablet 13    promethazine (PHENERGAN) 25 MG tablet Take 1 tablet (25 mg total) by mouth every 6 (six) hours as needed for nausea or vomiting. (Patient not taking: Reported on 09/03/2023) 30 tablet 2    scopolamine (TRANSDERM-SCOP) 1 MG/3DAYS Place 1 patch (1.5 mg total) onto the skin every 3 (three) days. 10 patch 0     Review of Systems Physical Exam   Blood pressure 113/76, pulse 98, temperature 98.1 F (36.7 C), temperature source Oral, resp. rate 16, height 5\' 6"  (1.676 m), weight 51.7 kg, last menstrual period 07/06/2023, SpO2  100%, currently breastfeeding.  Physical Exam NAD RRR CTAB Abdomen soft, non-tender    Assessment and Plan  23 yo g2p1001 @ 12+3 by L/8 here with nausea/vomiting of pregnancy. Tolerating fluids, no vomiting here. Normal fetal heart tones. No AKI or hyperkalemia on cmp or lft abnormality but does have some ketones on urine. Has been prescribed several anti-emetics but hasn't picked them up yet, is concerned about cost. We gave zofran here, patient says she does plan to go to pharmacy to pick up her prescription meds, will also provide instructions for dose and frequency of OTC doxylamine and B-6 in case cost of prescription meds is prohibitive. Advised on strategies to push hydration, and dehydration return precautions reviewed.   Betty Logan Betty Logan 10/01/2023, 3:55 PM

## 2023-10-01 NOTE — Discharge Instructions (Signed)
You can buy the following medications over-the-counter:  - Doxylamine: take 12.5 mg (half of a 25 mg tablet) before meals and at bedtime - Vitamin B-6: take 25 mg before meals and at bedtime

## 2023-10-01 NOTE — MAU Note (Signed)
Gift Betty Logan is a 22 y.o. at [redacted]w[redacted]d here in MAU reporting: been real sick, throwing up the last 24 hrs.  As soon as she eats and drinks, she throws up with in 5 min.  Has nothing in her system.  Rx has been sent to her pharmacy, but she doesn't have the money for it. Can't keep nothing down.  Has been throwing  up, but now it is so much worse.  Stomach is sore cause she is hungry. Her baby daddy wanted her to come in and get checked to see if the baby is ok and see if she is dehydrated Onset of complaint: past 24 hours Pain score: moderate Vitals:   10/01/23 1343  BP: 113/76  Pulse: 98  Resp: 16  Temp: 98.1 F (36.7 C)  SpO2: 100%     FHT:170 Lab orders placed from triage:  urine

## 2023-10-04 ENCOUNTER — Encounter: Payer: Self-pay | Admitting: Certified Nurse Midwife

## 2023-10-09 ENCOUNTER — Encounter: Payer: Medicare Other | Admitting: Obstetrics and Gynecology

## 2023-10-18 ENCOUNTER — Ambulatory Visit (INDEPENDENT_AMBULATORY_CARE_PROVIDER_SITE_OTHER): Payer: Self-pay | Admitting: Certified Nurse Midwife

## 2023-10-18 DIAGNOSIS — Z91199 Patient's noncompliance with other medical treatment and regimen due to unspecified reason: Secondary | ICD-10-CM

## 2023-10-18 NOTE — Progress Notes (Deleted)
   PRENATAL VISIT NOTE  Subjective:  Betty Logan is a 22 y.o. G2P1001 at [redacted]w[redacted]d being seen today for ongoing prenatal care.  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  Patient reports {sx:14538}.   .  .   . Denies leaking of fluid.   The following portions of the patient's history were reviewed and updated as appropriate: allergies, current medications, past family history, past medical history, past social history, past surgical history and problem list.   Objective:  There were no vitals filed for this visit.  Fetal Status:           General:  Alert, oriented and cooperative. Patient is in no acute distress.  Skin: Skin is warm and dry. No rash noted.   Cardiovascular: Normal heart rate noted  Respiratory: Normal respiratory effort, no problems with respiration noted  Abdomen: Soft, gravid, appropriate for gestational age.        Pelvic: {Blank single:19197::"Cervical exam performed in the presence of a chaperone","Cervical exam deferred"}        Extremities: Normal range of motion.     Mental Status: Normal mood and affect. Normal behavior. Normal judgment and thought content.   Assessment and Plan:  Pregnancy: G2P1001 at [redacted]w[redacted]d 1. Supervision of other normal pregnancy, antepartum (Primary) ***  2. [redacted] weeks gestation of pregnancy ***  3. Nausea and vomiting in pregnancy ***  {Blank single:19197::"Term","Preterm"} labor symptoms and general obstetric precautions including but not limited to vaginal bleeding, contractions, leaking of fluid and  fetal movement were reviewed in detail with the patient. Please refer to After Visit Summary for other counseling recommendations.   No follow-ups on file.  Future Appointments  Date Time Provider Department Center  10/18/2023  2:15 PM [facility]  11/16/2023 10:15 AM [facility] NURSE [facility]  11/16/2023 10:30 AM [facility] US5 [facility]    Richardson Landry, CNM

## 2023-11-07 NOTE — L&D Delivery Note (Signed)
 OB/GYN Faculty Practice Delivery Note  Betty Logan is a 23 y.o. G2P1001 s/p SVD at [redacted]w[redacted]d. She was admitted for IOL for cHTN.   ROM: 22h 60m with clear fluid GBS Status: Negative/-- (05/12 1715) Maximum Maternal Temperature: Temp (24hrs), Avg:98 F (36.7 C), Min:98 F (36.7 C), Max:98 F (36.7 C)   Labor Progress: Initial SVE: 1.5/30/-2. Pitocin  and Cytotec  required. She then progressed to complete.   Delivery Date/Time: 04/05/24 0432 Delivery: Called to room as FHT were hard to monitor. RN moved patient's leg for exam, and fetal head spontaneously delivered. Head delivered OA to LOA. Nuchal x3. Two were reduced prior to delivery; third afterwards. Shoulder and body delivered in usual fashion. Infant with spontaneous cry, placed on mother's abdomen, dried and stimulated. Cord clamped x 2 after +1-minute delay, and cut by patient's sister. Babe moved to warmer for suctioning. Cord gases not obtained. Cord blood drawn. Placenta delivered spontaneously with gentle cord traction. Fundus firm with massage and Pitocin . Labia, perineum, and vagina inspected with no lacerations. Babe returned to mom prior to my departure from the room. Mom and baby to postpartum. Baby Weight: pending  Placenta: 3 vessel, intact. Sent to L&D Complications: None Lacerations: None EBL: 50 mL Anesthesia: epidural  Infant: baby boy APGAR (1 MIN): 8 APGAR (5 MINS): 8  APGAR (10 MINS):    Maud Sorenson, MD [facility] Family Medicine Fellow, [facility] for [facility], [facility], [facility] 04/05/2024, 4:51 AM

## 2023-11-16 ENCOUNTER — Ambulatory Visit: Payer: Medicare Other | Attending: Obstetrics and Gynecology

## 2023-11-16 ENCOUNTER — Other Ambulatory Visit: Payer: Self-pay

## 2023-11-16 ENCOUNTER — Other Ambulatory Visit: Payer: Self-pay | Admitting: *Deleted

## 2023-11-16 ENCOUNTER — Ambulatory Visit: Payer: Medicare Other | Admitting: *Deleted

## 2023-11-16 ENCOUNTER — Encounter: Payer: Self-pay | Admitting: Obstetrics and Gynecology

## 2023-11-16 ENCOUNTER — Encounter: Payer: Self-pay | Admitting: *Deleted

## 2023-11-16 ENCOUNTER — Ambulatory Visit (HOSPITAL_BASED_OUTPATIENT_CLINIC_OR_DEPARTMENT_OTHER): Payer: Medicare Other | Admitting: Obstetrics

## 2023-11-16 VITALS — BP 113/69 | HR 87

## 2023-11-16 DIAGNOSIS — O35BXX0 Maternal care for other (suspected) fetal abnormality and damage, fetal cardiac anomalies, not applicable or unspecified: Secondary | ICD-10-CM | POA: Diagnosis present

## 2023-11-16 DIAGNOSIS — Z3480 Encounter for supervision of other normal pregnancy, unspecified trimester: Secondary | ICD-10-CM

## 2023-11-16 DIAGNOSIS — Z3A19 19 weeks gestation of pregnancy: Secondary | ICD-10-CM

## 2023-11-16 DIAGNOSIS — Z362 Encounter for other antenatal screening follow-up: Secondary | ICD-10-CM

## 2023-11-16 DIAGNOSIS — Z363 Encounter for antenatal screening for malformations: Secondary | ICD-10-CM | POA: Diagnosis not present

## 2023-11-16 DIAGNOSIS — O4412 Placenta previa with hemorrhage, second trimester: Secondary | ICD-10-CM | POA: Diagnosis not present

## 2023-11-16 DIAGNOSIS — O10912 Unspecified pre-existing hypertension complicating pregnancy, second trimester: Secondary | ICD-10-CM | POA: Insufficient documentation

## 2023-11-16 DIAGNOSIS — O4402 Placenta previa specified as without hemorrhage, second trimester: Secondary | ICD-10-CM

## 2023-11-16 DIAGNOSIS — Z8679 Personal history of other diseases of the circulatory system: Secondary | ICD-10-CM

## 2023-11-16 NOTE — Progress Notes (Signed)
 MFM Note  Betty Logan is currently at 19 weeks and 0 days.  She was seen for a detailed fetal anatomy scan due to chronic hypertension that is not treated with any medications.  Her blood pressure today was 113/69.  She denies any problems in her current pregnancy.    She had a cell free DNA test earlier in her pregnancy which indicated a low risk for trisomy 75, 64, and 13. A female fetus is predicted.   She was informed that the fetal growth and amniotic fluid level were appropriate for her gestational age.   On today's exam, an intracardiac echogenic focus was noted in the left ventricle of the fetal heart.    The small association between an echogenic focus and Down syndrome was discussed.   Due to the echogenic focus noted today, the patient was offered and declined an amniocentesis today for definitive diagnosis of fetal aneuploidy.  She reports that she is comfortable with her negative cell free DNA test.  Today's exam was limited due to the fetal position.  The patient was informed that anomalies may be missed due to technical limitations. If the fetus is in a suboptimal position or maternal habitus is increased, visualization of the fetus in the maternal uterus may be impaired.  Placenta previa was noted on today's exam.  The patient was advised that most cases of placenta previa will resolve later in pregnancy.    Should placenta previa persist at term, a cesarean delivery will be necessary.    A follow-up exam was scheduled in 5 weeks to assess the fetal growth and placental location.    The patient stated that all of her questions were answered today.  A total of 30 minutes was spent counseling and coordinating the care for this patient.  Greater than 50% of the time was spent in direct face-to-face contact.

## 2023-11-19 ENCOUNTER — Telehealth: Payer: Self-pay | Admitting: Family Medicine

## 2023-11-19 ENCOUNTER — Encounter: Payer: Self-pay | Admitting: Certified Nurse Midwife

## 2023-11-19 NOTE — Telephone Encounter (Signed)
 Patient called our office and left a Mychart message regarding recent results from MFM and to verify her EDD. Notified patient that her EDD is based on her LMP which is 04/11/2024. Patient also had questions about her US  scan this morning regarding placenta previa. Notified patient a physician will go into detail regarding her US  results. No future prenatal visit is scheduled. Will have the front office schedule prenatal visit from patient and the front office will contact patient once it is scheduled. Patient verbalized understanding.  Rosaline, RN

## 2023-11-19 NOTE — Telephone Encounter (Signed)
 Calling for lab results.

## 2023-11-27 ENCOUNTER — Encounter: Payer: Self-pay | Admitting: Family Medicine

## 2023-11-27 ENCOUNTER — Other Ambulatory Visit: Payer: Self-pay

## 2023-11-27 ENCOUNTER — Ambulatory Visit (INDEPENDENT_AMBULATORY_CARE_PROVIDER_SITE_OTHER): Payer: Medicare Other | Admitting: Family Medicine

## 2023-11-27 VITALS — BP 131/88 | HR 87 | Wt 125.8 lb

## 2023-11-27 DIAGNOSIS — Z3A20 20 weeks gestation of pregnancy: Secondary | ICD-10-CM

## 2023-11-27 DIAGNOSIS — O10919 Unspecified pre-existing hypertension complicating pregnancy, unspecified trimester: Secondary | ICD-10-CM

## 2023-11-27 DIAGNOSIS — F331 Major depressive disorder, recurrent, moderate: Secondary | ICD-10-CM

## 2023-11-27 DIAGNOSIS — Z3480 Encounter for supervision of other normal pregnancy, unspecified trimester: Secondary | ICD-10-CM

## 2023-11-27 DIAGNOSIS — O4402 Placenta previa specified as without hemorrhage, second trimester: Secondary | ICD-10-CM

## 2023-11-27 NOTE — Patient Instructions (Signed)

## 2023-11-27 NOTE — Progress Notes (Signed)
   Subjective:  Betty Logan is a 23 y.o. G2P1001 at [redacted]w[redacted]d being seen today for ongoing prenatal care.  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  Patient reports no complaints.  Contractions: Not present. Vag. Bleeding: None.  Movement: Present. Denies leaking of fluid.   The following portions of the patient's history were reviewed and updated as appropriate: allergies, current medications, past family history, past medical history, past social history, past surgical history and problem list. Problem list updated.  Objective:   Vitals:   11/27/23 1649  BP: 131/88  Pulse: 87  Weight: 125 lb 12.8 oz (57.1 kg)    Fetal Status: Fetal Heart Rate (bpm): 160   Movement: Present     General:  Alert, oriented and cooperative. Patient is in no acute distress.  Skin: Skin is warm and dry. No rash noted.   Cardiovascular: Normal heart rate noted  Respiratory: Normal respiratory effort, no problems with respiration noted  Abdomen: Soft, gravid, appropriate for gestational age. Pain/Pressure: Absent     Pelvic: Vag. Bleeding: None     Cervical exam deferred        Extremities: Normal range of motion.  Edema: None  Mental Status: Normal mood and affect. Normal behavior. Normal judgment and thought content.   Urinalysis:      Assessment and Plan:  Pregnancy: G2P1001 at [redacted]w[redacted]d  1. Supervision of other normal pregnancy, antepartum (Primary) BP and FHR normal Accepts AFP but no phlebotomist, will schedule blood draw at checkout  2. history Chronic Hypertension affection  pregnancy Normotensive Not taking ASA, encouraged to do so and discussed rationale  3. Placenta previa antepartum in second trimester Central previa noted at anatomy scan, f/w MFM Reinforced pelvic precautions   Preterm labor symptoms and general obstetric precautions including but not limited to vaginal bleeding, contractions, leaking of fluid and fetal movement were reviewed in detail with the patient. Please refer to After Visit Summary for other counseling recommendations.  Return in 4 weeks (on 12/25/2023) for [facility], ob visit.   Venora Maples, MD

## 2023-12-03 ENCOUNTER — Other Ambulatory Visit: Payer: Medicare Other

## 2023-12-08 ENCOUNTER — Inpatient Hospital Stay (HOSPITAL_COMMUNITY)
Admission: AD | Admit: 2023-12-08 | Discharge: 2023-12-08 | Disposition: A | Discharge status: Home / Self Care | Payer: Medicare Other | Attending: Obstetrics & Gynecology | Admitting: Obstetrics & Gynecology

## 2023-12-08 DIAGNOSIS — O26892 Other specified pregnancy related conditions, second trimester: Secondary | ICD-10-CM | POA: Diagnosis not present

## 2023-12-08 DIAGNOSIS — R1031 Right lower quadrant pain: Secondary | ICD-10-CM | POA: Diagnosis not present

## 2023-12-08 DIAGNOSIS — Z3A22 22 weeks gestation of pregnancy: Secondary | ICD-10-CM | POA: Diagnosis not present

## 2023-12-08 DIAGNOSIS — Z711 Person with feared health complaint in whom no diagnosis is made: Secondary | ICD-10-CM

## 2023-12-08 DIAGNOSIS — O26891 Other specified pregnancy related conditions, first trimester: Secondary | ICD-10-CM | POA: Diagnosis present

## 2023-12-08 NOTE — MAU Provider Note (Signed)
Event Date/Time   First Provider Initiated Contact with Patient 12/08/23 1354      S [name] is a 23 y.o. G2P1001 patient who presents to MAU today without complaints. Patient states that she had lower abdominal pain x1 last night at 2200 that lasted about 30-mins. She states that she has not felt it since and has no other complaints. She reports that she has a previa and is concerned if they are related. She denies vaginal bleeding, leaking of fluid and contractions. She reports +FM.    O BP 104/69 (BP Location: Right Arm)   Pulse 92   Temp 98 F (36.7 C) (Oral)   Resp 17   Ht 5\' 6"  (1.676 m)   Wt 57.6 kg   LMP 07/06/2023 (Exact Date)   SpO2 100%   BMI 20.48 kg/m  Physical Exam Vitals and nursing note reviewed.  Constitutional:      General: She is not in acute distress.    Appearance: Normal appearance.  HENT:     Head: Normocephalic.  Pulmonary:     Effort: Pulmonary effort is normal.  Musculoskeletal:     Cervical back: Normal range of motion.  Skin:    General: Skin is warm and dry.  Neurological:     Mental Status: She is alert and oriented to person, place, and time.  Psychiatric:        Mood and Affect: Mood normal.     FHT obtained in triage.   A Medical screening exam complete - plan for discharge   P 1. Physically well but worried   2. [redacted] weeks gestation of pregnancy   3. Right lower quadrant abdominal pain     -Discharge from MAU in stable condition - Reviewed normal 2nd trimester expectations.  - Discussed bleeding precautions related to Previa and information provided.  - Patient given the option of transfer to [facility] for further evaluation or seek care in outpatient facility of choice  Warning signs for worsening condition that would warrant emergency follow-up discussed Patient may return to MAU as needed   Carlynn Herald, CNM 12/08/2023 2:04 PM

## 2023-12-08 NOTE — MAU Note (Signed)
Betty Logan is a 23 y.o. at [redacted]w[redacted]d here in MAU reporting: last night around 11-12 last night, was having extreme pain on rt side, all through side below ribs.  Pain lasted about , than stopped.  Has not had any pain since.  Did not come last night because she did not have a ride.  The reason she came is she has been told the placenta is covering in the cervix, she did not know if the pain was related or if this was a problem.  Denies any bleeding. Denies any recent intercourse. No diarrhea or constipation. No urinary problems.  Is feeling fetal movement.   Onset of complaint: last night around midnight Pain score: none Vitals:   12/08/23 1348  BP: 104/69  Pulse: 92  Resp: 17  Temp: 98 F (36.7 C)  SpO2: 100%     FHT:162 Lab orders placed from triage: none

## 2023-12-21 ENCOUNTER — Ambulatory Visit: Payer: Medicare Other | Admitting: *Deleted

## 2023-12-21 ENCOUNTER — Ambulatory Visit: Payer: Medicare Other | Attending: Obstetrics

## 2023-12-21 ENCOUNTER — Other Ambulatory Visit: Payer: Self-pay | Admitting: *Deleted

## 2023-12-21 VITALS — BP 117/73

## 2023-12-21 DIAGNOSIS — O4402 Placenta previa specified as without hemorrhage, second trimester: Secondary | ICD-10-CM | POA: Diagnosis not present

## 2023-12-21 DIAGNOSIS — Z3A24 24 weeks gestation of pregnancy: Secondary | ICD-10-CM | POA: Insufficient documentation

## 2023-12-21 DIAGNOSIS — Z362 Encounter for other antenatal screening follow-up: Secondary | ICD-10-CM | POA: Diagnosis present

## 2023-12-21 DIAGNOSIS — O2692 Pregnancy related conditions, unspecified, second trimester: Secondary | ICD-10-CM | POA: Insufficient documentation

## 2023-12-21 DIAGNOSIS — Z3480 Encounter for supervision of other normal pregnancy, unspecified trimester: Secondary | ICD-10-CM

## 2023-12-21 DIAGNOSIS — Z8679 Personal history of other diseases of the circulatory system: Secondary | ICD-10-CM

## 2023-12-21 DIAGNOSIS — O361920 Maternal care for other isoimmunization, second trimester, not applicable or unspecified: Secondary | ICD-10-CM | POA: Diagnosis not present

## 2023-12-25 ENCOUNTER — Encounter: Payer: Medicare Other | Admitting: Obstetrics and Gynecology

## 2023-12-28 ENCOUNTER — Encounter: Payer: Medicare Other | Admitting: Obstetrics and Gynecology

## 2024-01-08 ENCOUNTER — Encounter: Payer: Medicare Other | Admitting: Advanced Practice Midwife

## 2024-01-10 ENCOUNTER — Other Ambulatory Visit: Payer: Self-pay

## 2024-01-10 ENCOUNTER — Ambulatory Visit (INDEPENDENT_AMBULATORY_CARE_PROVIDER_SITE_OTHER): Admitting: Certified Nurse Midwife

## 2024-01-10 VITALS — BP 114/73 | HR 92 | Wt 128.0 lb

## 2024-01-10 DIAGNOSIS — Z3A26 26 weeks gestation of pregnancy: Secondary | ICD-10-CM

## 2024-01-10 DIAGNOSIS — O4402 Placenta previa specified as without hemorrhage, second trimester: Secondary | ICD-10-CM | POA: Diagnosis not present

## 2024-01-10 DIAGNOSIS — Z3492 Encounter for supervision of normal pregnancy, unspecified, second trimester: Secondary | ICD-10-CM

## 2024-01-10 NOTE — Progress Notes (Signed)
   PRENATAL VISIT NOTE  Subjective:  Betty Logan is a 23 y.o. G2P1001 at [redacted]w[redacted]d being seen today for ongoing prenatal care.  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  Patient reports no complaints.  Contractions: Not present. Vag. Bleeding: None.  Movement: Present. Denies leaking of fluid.   The following portions of the patient's history were reviewed and updated as appropriate: allergies, current medications, past family history, past medical history, past social history, past surgical history and problem list.   Objective:   Vitals:   01/10/24 1607  BP: 114/73  Pulse: 92  Weight: 128 lb (58.1 kg)    Fetal Status: Fetal Heart Rate (bpm): 150   Movement: Present     General:  Alert, oriented and cooperative. Patient is in no acute distress.  Skin: Skin is warm and dry. No rash noted.   Cardiovascular: Normal heart rate noted  Respiratory: Normal respiratory effort, no problems with respiration noted  Abdomen: Soft, gravid, appropriate for gestational age.  Pain/Pressure: Absent     Pelvic: Cervical exam deferred        Extremities: Normal range of motion.  Edema: None  Mental Status: Normal mood and affect. Normal behavior. Normal judgment and thought content.   Assessment and Plan:  Pregnancy: G2P1001 at [redacted]w[redacted]d 1. Encounter for supervision of low-risk pregnancy in second trimester (Primary) - patient doing well.  - Reports vigorous and frequent fetal movement  - Patient no longer taking PNV, d/t NV. Recommended to at least take 1 per week.   2. [redacted] weeks  gestation of pregnancy - GTT at next visit,  - Reviewed dietary restrictions for the night prior to the visit.   3. Placenta previa in second trimester - Placenta Previa RESOLVED   Preterm labor symptoms and general obstetric precautions including but not limited to vaginal bleeding, contractions, leaking of fluid and fetal movement were reviewed in detail with the patient. Please refer to After Visit Summary for other counseling recommendations.   Return in about 2 weeks (around 01/24/2024) for LOB w GTT.  Future Appointments  Date Time Provider Department Center  02/15/2024  2:30 PM [facility] US5 [facility]    Cali Cuartas Danella Deis) Suzie Portela, MSN, CNM  Center for [facility]  01/10/2024 4:24 PM

## 2024-01-10 NOTE — Patient Instructions (Signed)
   Considering Waterbirth? Guide for patients at Center for [facility]) Why consider waterbirth? Gentle birth for babies  Less pain medicine used in labor  May allow for passive descent/less pushing  May reduce perineal tears  More mobility and instinctive maternal position changes  Increased maternal relaxation   Is waterbirth safe? What are the risks of infection, drowning or other complications? Infection:  Very low risk (3.7 % for tub vs 4.8% for bed)  7 in 8000 waterbirths with documented infection  Poorly cleaned equipment most common cause  Slightly lower group B strep transmission rate  Drowning  Maternal:  Very low risk  Related to seizures or fainting  Newborn:  Very low risk. No evidence of increased risk of respiratory problems in multiple large studies  Physiological protection from breathing under water  Avoid underwater birth if there are any fetal complications  Once baby's head is out of the water, keep it out.  Birth complication  Some reports of cord trauma, but risk decreased by bringing baby to surface gradually  No evidence of increased risk of shoulder dystocia. Mothers can usually change positions faster in water than in a bed, possibly aiding the maneuvers to free the shoulder.   There are 2 things you MUST do to have a waterbirth with [facility]: Attend a waterbirth class at [facility] at [facility]   3rd Wednesday of every month from 7-9 pm (virtual during COVID) [employer] at [email or web address] or [email or web address] or by calling [phone] Bring [place] the certificate from the class to your prenatal appointment or send via MyChart Meet with a midwife at 36 weeks* to see if you can still plan a waterbirth and to sign the consent.   *We also recommend that you schedule as many of your prenatal visits with a midwife as possible.    Helpful information: You may want to bring a bathing suit top to the hospital  to wear during labor but this is optional.  All other supplies are provided by the hospital. Please arrive at the hospital with signs of active labor, and do not wait at home until late in labor. It takes 45 min- 1 hour for fetal monitoring, and check in to your room to take place, plus transport and filling of the waterbirth tub.    Things that would prevent you from having a waterbirth: Premature, <37wks  Previous cesarean birth  Presence of thick meconium-stained fluid  Multiple gestation (Twins, triplets, etc.)  Uncontrolled diabetes or gestational diabetes requiring medication  Hypertension diagnosed in pregnancy or preexisting hypertension (gestational hypertension, preeclampsia, or chronic hypertension) Fetal growth restriction (your baby measures less than 10th percentile on ultrasound) Heavy vaginal bleeding  Non-reassuring fetal heart rate  Active infection (MRSA, etc.). Group B Strep is NOT a contraindication for waterbirth.  If your labor has to be induced and induction method requires continuous monitoring of the baby's heart rate  Other risks/issues identified by your obstetrical provider   Please remember that birth is unpredictable. Under certain unforeseeable circumstances your provider may advise against giving birth in the tub. These decisions will be made on a case-by-case basis and with the safety of you and your baby as our highest priority.    Updated 02/08/22

## 2024-01-22 ENCOUNTER — Other Ambulatory Visit: Payer: Self-pay

## 2024-01-22 DIAGNOSIS — O0993 Supervision of high risk pregnancy, unspecified, third trimester: Secondary | ICD-10-CM

## 2024-01-22 DIAGNOSIS — Z3A28 28 weeks gestation of pregnancy: Secondary | ICD-10-CM

## 2024-01-22 DIAGNOSIS — Z114 Encounter for screening for human immunodeficiency virus [HIV]: Secondary | ICD-10-CM

## 2024-01-23 NOTE — Progress Notes (Unsigned)
   PRENATAL VISIT NOTE  Subjective:  Betty Logan is a 23 y.o. G2P1001 at [redacted]w[redacted]d being seen today for ongoing prenatal care.  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  Patient reports {sx:14538}.   .  .   . Denies leaking of fluid.   The following portions of the patient's history were reviewed and updated as appropriate: allergies, current medications, past family history, past medical history, past social history, past surgical history and problem list.   Objective:  There were no vitals filed for this visit.  Fetal Status:           General:  Alert, oriented and cooperative. Patient is in no acute distress.  Skin: Skin is warm and dry. No rash noted.   Cardiovascular: Normal heart rate noted  Respiratory: Normal respiratory effort, no problems with respiration noted  Abdomen: Soft, gravid, appropriate for gestational age.        Pelvic: {Blank single:19197::"Cervical exam performed in the presence of a chaperone","Cervical exam deferred"}        Extremities: Normal range of motion.     Mental Status: Normal mood and affect. Normal behavior. Normal judgment and thought content.   Assessment and Plan:  Pregnancy: G2P1001 at [redacted]w[redacted]d 1. Encounter for supervision of low-risk pregnancy in third trimester (Primary) ***  2. [redacted] weeks gestation of pregnancy ***  {Blank single:19197::"Term","Preterm"} labor symptoms and general obstetric precautions including but not limited to vaginal bleeding, contractions, leaking of fluid and fetal  movement were reviewed in detail with the patient. Please refer to After Visit Summary for other counseling recommendations.   Return in about 2 weeks (around 02/07/2024) for LOB.  Future Appointments  Date Time Provider Department Center  01/24/2024  8:20 AM [facility]  01/24/2024  9:15 AM Leafy Half [facility]  02/15/2024  2:30 PM [facility] US5 [facility]    Richardson Landry, CNM

## 2024-01-24 ENCOUNTER — Other Ambulatory Visit: Payer: Self-pay

## 2024-01-24 ENCOUNTER — Ambulatory Visit: Payer: Self-pay | Admitting: Certified Nurse Midwife

## 2024-01-24 ENCOUNTER — Other Ambulatory Visit (HOSPITAL_COMMUNITY)
Admission: RE | Admit: 2024-01-24 | Discharge: 2024-01-24 | Disposition: A | Discharge status: Home / Self Care | Source: Ambulatory Visit | Attending: Certified Nurse Midwife | Admitting: Certified Nurse Midwife

## 2024-01-24 ENCOUNTER — Other Ambulatory Visit

## 2024-01-24 VITALS — BP 125/84 | HR 106 | Wt 134.9 lb

## 2024-01-24 DIAGNOSIS — M549 Dorsalgia, unspecified: Secondary | ICD-10-CM | POA: Diagnosis not present

## 2024-01-24 DIAGNOSIS — Z3A28 28 weeks gestation of pregnancy: Secondary | ICD-10-CM

## 2024-01-24 DIAGNOSIS — Z3480 Encounter for supervision of other normal pregnancy, unspecified trimester: Secondary | ICD-10-CM

## 2024-01-24 DIAGNOSIS — Z3493 Encounter for supervision of normal pregnancy, unspecified, third trimester: Secondary | ICD-10-CM | POA: Insufficient documentation

## 2024-01-24 DIAGNOSIS — O0993 Supervision of high risk pregnancy, unspecified, third trimester: Secondary | ICD-10-CM

## 2024-01-24 DIAGNOSIS — Z114 Encounter for screening for human immunodeficiency virus [HIV]: Secondary | ICD-10-CM

## 2024-01-24 LAB — POCT URINALYSIS DIP (DEVICE)
Bilirubin Urine: NEGATIVE
Glucose, UA: NEGATIVE mg/dL
Hgb urine dipstick: NEGATIVE
Ketones, ur: NEGATIVE mg/dL
Nitrite: NEGATIVE
Protein, ur: NEGATIVE mg/dL
Specific Gravity, Urine: >=1.03 (ref 1.005–1.030)
Urobilinogen, UA: 0.2 mg/dL (ref 0.0–1.0)
pH: 5.5 (ref 5.0–8.0)

## 2024-01-24 MED ORDER — CYCLOBENZAPRINE HCL 5 MG PO TABS
5.0000 mg | ORAL_TABLET | Freq: Three times a day (TID) | ORAL | 0 refills | Status: DC | PRN
Start: 2024-01-24 — End: 2024-04-07

## 2024-01-25 LAB — CERVICOVAGINAL ANCILLARY ONLY
Bacterial Vaginitis (gardnerella): NEGATIVE
Candida Glabrata: NEGATIVE
Candida Vaginitis: POSITIVE — AB
Chlamydia: NEGATIVE
Comment: NEGATIVE
Comment: NEGATIVE
Comment: NEGATIVE
Comment: NEGATIVE
Comment: NEGATIVE
Comment: NORMAL
Neisseria Gonorrhea: POSITIVE — AB
Trichomonas: NEGATIVE

## 2024-01-26 ENCOUNTER — Inpatient Hospital Stay (HOSPITAL_COMMUNITY)
Admission: AD | Admit: 2024-01-26 | Discharge: 2024-01-26 | Disposition: A | Discharge status: Home / Self Care | Attending: Obstetrics and Gynecology | Admitting: Obstetrics and Gynecology

## 2024-01-26 ENCOUNTER — Encounter: Payer: Self-pay | Admitting: Certified Nurse Midwife

## 2024-01-26 ENCOUNTER — Other Ambulatory Visit: Payer: Self-pay

## 2024-01-26 DIAGNOSIS — Z3A29 29 weeks gestation of pregnancy: Secondary | ICD-10-CM | POA: Insufficient documentation

## 2024-01-26 DIAGNOSIS — O98213 Gonorrhea complicating pregnancy, third trimester: Secondary | ICD-10-CM | POA: Insufficient documentation

## 2024-01-26 DIAGNOSIS — O26893 Other specified pregnancy related conditions, third trimester: Secondary | ICD-10-CM

## 2024-01-26 DIAGNOSIS — B3731 Acute candidiasis of vulva and vagina: Secondary | ICD-10-CM | POA: Diagnosis not present

## 2024-01-26 DIAGNOSIS — O4403 Placenta previa specified as without hemorrhage, third trimester: Secondary | ICD-10-CM | POA: Diagnosis not present

## 2024-01-26 DIAGNOSIS — O98813 Other maternal infectious and parasitic diseases complicating pregnancy, third trimester: Secondary | ICD-10-CM | POA: Diagnosis not present

## 2024-01-26 DIAGNOSIS — O4402 Placenta previa specified as without hemorrhage, second trimester: Secondary | ICD-10-CM

## 2024-01-26 DIAGNOSIS — A549 Gonococcal infection, unspecified: Secondary | ICD-10-CM | POA: Diagnosis not present

## 2024-01-26 DIAGNOSIS — Z3480 Encounter for supervision of other normal pregnancy, unspecified trimester: Secondary | ICD-10-CM

## 2024-01-26 MED ORDER — TERCONAZOLE 0.8 % VA CREA
1.0000 | TOPICAL_CREAM | Freq: Every day | VAGINAL | 0 refills | Status: DC
Start: 2024-01-26 — End: 2024-04-07

## 2024-01-26 MED ORDER — LIDOCAINE HCL (PF) 1 % IJ SOLN
1.0000 mL | Freq: Once | INTRAMUSCULAR | Status: AC
  Administered 2024-01-26: 1 mL
  Filled 2024-01-26: qty 5

## 2024-01-26 MED ORDER — CEFTRIAXONE SODIUM 500 MG IJ SOLR
500.0000 mg | Freq: Once | INTRAMUSCULAR | Status: AC
  Administered 2024-01-26: 500 mg via INTRAMUSCULAR
  Filled 2024-01-26: qty 500

## 2024-01-26 MED ORDER — CEFTRIAXONE SODIUM 1 G IJ SOLR: 1.0000 g | Freq: Once | INTRAMUSCULAR | Status: DC

## 2024-01-26 MED ORDER — SODIUM CHLORIDE 0.9 % IV SOLN
1.0000 g | Freq: Once | INTRAVENOUS | Status: DC
  Filled 2024-01-26: qty 10

## 2024-01-26 NOTE — MAU Note (Signed)
 Betty Logan is a 23 y.o. at [redacted]w[redacted]d here in MAU reporting: that she tested positive for gonorrhea on Thursday 01/24/24 and is looking for treatment. Patient denies having contractions, no LOF, no vaginal bleeding. Patient does feel fetal movement.   LMP: 07/06/2023 Onset of complaint: Thursday 01/24/2024 Pain score: 0 Vitals:   01/26/24 1324  BP: 111/71  Pulse: 90  Resp: 18  Temp: 98.8 F (37.1 C)  SpO2: 100%     FHT: 155

## 2024-01-26 NOTE — MAU Provider Note (Signed)
 S [name] is a 23 y.o. G2P1001 patient who presents to MAU today with complaint of result for Neisseria Gonorrhea was resulted positive from visit on 01/24/24.   Patient reports she saw the result and therefore is requesting her treatment. She also is aware that her partner needs to be treated but said  " I think he knows and didn't tell me" She understands that he needs to be notified & treated and that she will need to abstain until he is treated appropriately. She also is aware that she will need TOC at her OB's office. She offers no OB c/o today and denies any VB, LOF, CTX and reports good FM's   Review of Systems  All other systems reviewed and are negative.   O BP 111/71 (BP Location: Right Arm)   Pulse 90   Temp 98.8 F (37.1 C) (Oral)   Resp 18   Ht 5\' 6"  (1.676 m)   Wt 61.2 kg   LMP 07/06/2023 (Exact Date)   SpO2 100%   BMI 21.77 kg/m  Physical Exam Constitutional:      Appearance: Normal appearance.  Cardiovascular:     Rate and Rhythm: Normal rate.  Pulmonary:     Effort: Pulmonary effort is normal.     Breath sounds: Normal breath sounds.  Abdominal:     Palpations: Abdomen is soft.  Musculoskeletal:        General: Normal range of motion.     Cervical back: Normal range of motion.  Skin:    General: Skin is warm.  Neurological:     Mental Status: She is alert and oriented to person, place, and time.  Psychiatric:        Behavior: Behavior normal.    Meds ordered this encounter  Medications   DISCONTD: cefTRIAXone (ROCEPHIN) 1 g in sodium chloride 0.9 % 100 mL IVPB    Antibiotic Indication::   STD   DISCONTD: cefTRIAXone (ROCEPHIN) injection 1 g    Antibiotic Indication::   STD   cefTRIAXone (ROCEPHIN) injection 500 mg    Antibiotic Indication::   STD   lidocaine (PF) (XYLOCAINE) 1 % injection 1 mL   terconazole (TERAZOL 3) 0.8 % vaginal cream    Sig: Place 1 applicator vaginally at bedtime.    Dispense:  20 g    Refill:  0     Supervising Provider:   Samara Snide     Orders Placed This Encounter  Procedures   Discharge patient Discharge disposition: 01-Home or Self Care; Discharge patient date: 01/26/2024    Standing Status:   Standing    Number of Occurrences:   1    Discharge disposition:   01-Home or Self Care [1]    Discharge patient date:   01/26/2024     ASSESSMENT Medical screening exam complete  Gonorrhea  Supervision of other normal pregnancy, antepartum  Placenta previa antepartum in second trimester  [redacted] weeks gestation of pregnancy  Vaginal yeast infection    PLAN Medication administered with S/R/B and appropriate STD counseling and partner notification and treatment discussed with patient. She verbalized understanding  Future Appointments  Date Time Provider Department Center  02/07/2024  2:35 PM Christean Leaf [facility]  02/15/2024  2:30 PM [facility] US5 [facility]    Discharge from MAU in stable condition TOC - patient aware  Warning signs for worsening condition that would warrant emergency follow-up discussed Patient may return to MAU as needed   Betty Logan,  Wilmer Floor, NP 01/26/2024 2:11 PM

## 2024-01-29 ENCOUNTER — Encounter: Payer: Self-pay | Admitting: Certified Nurse Midwife

## 2024-01-29 LAB — GLUCOSE TOLERANCE, 2 HOURS W/ 1HR
Glucose, 1 hour: 74 mg/dL (ref 70–179)
Glucose, 2 hour: 71 mg/dL (ref 70–152)
Glucose, Fasting: 74 mg/dL (ref 70–91)

## 2024-01-29 LAB — AFP, SERUM, OPEN SPINA BIFIDA
AFP Value: 212.5 ng/mL
Gest. Age on Collection Date: 28.7 wk
Maternal Age At EDD: 23.8 a
Weight: 125 [lb_av]

## 2024-01-29 LAB — CBC
Hematocrit: 35.5 % (ref 34.0–46.6)
Hemoglobin: 12.1 g/dL (ref 11.1–15.9)
MCH: 31.8 pg (ref 26.6–33.0)
MCHC: 34.1 g/dL (ref 31.5–35.7)
MCV: 93 fL (ref 79–97)
Platelets: 250 10*3/uL (ref 150–450)
RBC: 3.8 x10E6/uL (ref 3.77–5.28)
RDW: 12.5 % (ref 11.7–15.4)
WBC: 5.2 10*3/uL (ref 3.4–10.8)

## 2024-01-29 LAB — HIV ANTIBODY (ROUTINE TESTING W REFLEX): HIV Screen 4th Generation wRfx: NONREACTIVE

## 2024-01-29 LAB — RPR: RPR Ser Ql: NONREACTIVE

## 2024-02-04 NOTE — Progress Notes (Unsigned)
   PRENATAL VISIT NOTE  Subjective:  Betty Logan is a 23 y.o. G2P1001 at [redacted]w[redacted]d being seen today for ongoing prenatal care.  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  Patient reports no complaints.  Contractions: Irritability. Vag. Bleeding: None.  Movement: Present. Denies leaking of fluid.   The following portions of the patient's history were reviewed and updated as appropriate: allergies, current medications, past family history, past medical history, past social history, past surgical history and problem list.   Objective:   Vitals:   02/07/24 1444  BP: 129/85  Pulse: 88  Weight: 134 lb 9.6 oz (61.1 kg)    Fetal Status: Fetal Heart Rate (bpm): 148   Movement: Present     General:  Alert, oriented and cooperative. Patient is in no acute distress.  Skin: Skin is warm and dry. No rash noted.   Cardiovascular: Normal heart rate noted  Respiratory: Normal respiratory effort, no problems with respiration noted  Abdomen: Soft, gravid, appropriate for gestational age.  Pain/Pressure: Present     Pelvic: Cervical exam deferred        Extremities: Normal range of motion.  Edema: None  Mental Status: Normal mood and affect. Normal behavior. Normal judgment and thought content.   Assessment and Plan:  Pregnancy: G2P1001 at [redacted]w[redacted]d  1. Supervision of other normal pregnancy, antepartum (Primary) Patient is doing well, feeling regular fetal movement BP, FHR, FH appropriate  2. [redacted] weeks gestation of pregnancy Anticipatory guidance about next visits/weeks of pregnancy  given.  - Tdap vaccine greater than or equal to 7yo IM  4. Chronic Hypertension affecting pregnancy Patient not taking aspirin Normotensive, no current meds Normal baseline labs Growth [place] scheduled 02/15/24 Delivery at 39 weeks Ssxs preE reviewed   Preterm labor symptoms and general obstetric precautions including but not limited to vaginal bleeding, contractions, leaking of fluid and fetal movement were reviewed in detail with the patient.  Please refer to After Visit Summary for other counseling recommendations.   Return in about 2 weeks (around 02/21/2024) for LOB.  Future Appointments  Date Time Provider Department Center  02/15/2024  2:30 PM [facility] US5 [facility]    Ralene Muskrat, PA-C

## 2024-02-07 ENCOUNTER — Ambulatory Visit: Admitting: Physician Assistant

## 2024-02-07 ENCOUNTER — Other Ambulatory Visit: Payer: Self-pay

## 2024-02-07 ENCOUNTER — Encounter: Payer: Self-pay | Admitting: Physician Assistant

## 2024-02-07 VITALS — BP 129/85 | HR 88 | Wt 134.6 lb

## 2024-02-07 DIAGNOSIS — O10913 Unspecified pre-existing hypertension complicating pregnancy, third trimester: Secondary | ICD-10-CM | POA: Diagnosis not present

## 2024-02-07 DIAGNOSIS — Z23 Encounter for immunization: Secondary | ICD-10-CM

## 2024-02-07 DIAGNOSIS — O10919 Unspecified pre-existing hypertension complicating pregnancy, unspecified trimester: Secondary | ICD-10-CM

## 2024-02-07 DIAGNOSIS — Z3A30 30 weeks gestation of pregnancy: Secondary | ICD-10-CM

## 2024-02-07 DIAGNOSIS — Z3480 Encounter for supervision of other normal pregnancy, unspecified trimester: Secondary | ICD-10-CM

## 2024-02-15 ENCOUNTER — Ambulatory Visit: Payer: Medicare Other | Attending: Obstetrics and Gynecology

## 2024-02-15 ENCOUNTER — Ambulatory Visit: Attending: Obstetrics and Gynecology | Admitting: Obstetrics

## 2024-02-15 VITALS — BP 142/91 | HR 90

## 2024-02-15 DIAGNOSIS — Z3A31 31 weeks gestation of pregnancy: Secondary | ICD-10-CM

## 2024-02-15 DIAGNOSIS — O10913 Unspecified pre-existing hypertension complicating pregnancy, third trimester: Secondary | ICD-10-CM | POA: Diagnosis not present

## 2024-02-15 DIAGNOSIS — O4402 Placenta previa specified as without hemorrhage, second trimester: Secondary | ICD-10-CM | POA: Insufficient documentation

## 2024-02-15 DIAGNOSIS — O360130 Maternal care for anti-D [Rh] antibodies, third trimester, not applicable or unspecified: Secondary | ICD-10-CM | POA: Diagnosis not present

## 2024-02-15 DIAGNOSIS — O10013 Pre-existing essential hypertension complicating pregnancy, third trimester: Secondary | ICD-10-CM | POA: Diagnosis not present

## 2024-02-15 DIAGNOSIS — Z362 Encounter for other antenatal screening follow-up: Secondary | ICD-10-CM | POA: Insufficient documentation

## 2024-02-15 DIAGNOSIS — Z3689 Encounter for other specified antenatal screening: Secondary | ICD-10-CM | POA: Diagnosis not present

## 2024-02-15 DIAGNOSIS — Z3A32 32 weeks gestation of pregnancy: Secondary | ICD-10-CM

## 2024-02-15 DIAGNOSIS — O358XX0 Maternal care for other (suspected) fetal abnormality and damage, not applicable or unspecified: Secondary | ICD-10-CM | POA: Insufficient documentation

## 2024-02-15 DIAGNOSIS — Z3480 Encounter for supervision of other normal pregnancy, unspecified trimester: Secondary | ICD-10-CM | POA: Insufficient documentation

## 2024-02-15 DIAGNOSIS — Z8679 Personal history of other diseases of the circulatory system: Secondary | ICD-10-CM | POA: Insufficient documentation

## 2024-02-15 DIAGNOSIS — O4400 Placenta previa specified as without hemorrhage, unspecified trimester: Secondary | ICD-10-CM | POA: Insufficient documentation

## 2024-02-15 NOTE — Progress Notes (Signed)
 MFM Consult Note  Betty Logan is currently at 32 weeks and 0 days.  She has been followed due to history of chronic hypertension that is not treated with any medications.  Her blood pressure today was 142/91.  She denies any signs or symptoms of preeclampsia.  On today's exam, the overall EFW of 4 pounds 13 ounces measures at the 86 percentile.    There was normal amniotic fluid noted with a total AFI of 18 cm.  Preeclampsia precautions were reviewed today.    Due to her history of chronic hypertension, delivery should occur at around 39 weeks.    Delivery prior to 39 weeks may be indicated should she develop preeclampsia or should she have worsening blood pressures.    As the fetal growth is within normal limits, no further exams were scheduled in our office.    The patient stated that all of her questions were answered today.  A total of 20 minutes was spent counseling and coordinating the care for this patient.  Greater than 50% of the time was spent in direct face-to-face contact.

## 2024-02-21 ENCOUNTER — Encounter (HOSPITAL_COMMUNITY): Payer: Self-pay | Admitting: Obstetrics & Gynecology

## 2024-02-21 ENCOUNTER — Inpatient Hospital Stay (HOSPITAL_COMMUNITY)
Admission: AD | Admit: 2024-02-21 | Discharge: 2024-02-21 | Disposition: A | Discharge status: Home / Self Care | Attending: Obstetrics & Gynecology | Admitting: Obstetrics & Gynecology

## 2024-02-21 ENCOUNTER — Other Ambulatory Visit: Payer: Self-pay

## 2024-02-21 DIAGNOSIS — Z3A25 25 weeks gestation of pregnancy: Secondary | ICD-10-CM | POA: Diagnosis not present

## 2024-02-21 DIAGNOSIS — O26892 Other specified pregnancy related conditions, second trimester: Secondary | ICD-10-CM | POA: Diagnosis present

## 2024-02-21 DIAGNOSIS — N898 Other specified noninflammatory disorders of vagina: Secondary | ICD-10-CM | POA: Insufficient documentation

## 2024-02-21 LAB — WET PREP, GENITAL
Clue Cells Wet Prep HPF POC: NONE SEEN
Sperm: NONE SEEN
Trich, Wet Prep: NONE SEEN
WBC, Wet Prep HPF POC: <10 (ref <10)
Yeast Wet Prep HPF POC: NONE SEEN

## 2024-02-21 NOTE — MAU Provider Note (Signed)
 History     CSN: [id number]  Arrival date and time: 02/21/24 1708   None     Chief Complaint  Patient presents with   lost mucus plug   HPI Patient is a 23 year old G2, P1 at 32 weeks 6 days presenting for concern for losing mucous plug.  Reports that she has noticed some abnormal discharge from her vagina and was concerned that her mucous plug had come out and was worried that she was going into labor.  Denies any contractions.  Denies any vaginal bleeding.  Reports good fetal movement.  No other concerns.  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 Was also negative one year ago, but positive 11/2018.     04/06/2021: Called and left VM for Tammy Faucet, TB control [facility] to discuss patient diagnosis and treatment. Per Dr. Crissie Reese, with negative Quantiferon-Gold, pt does not need any additional work-up for TB at this time.        Nausea and vomiting in pregnancy 03/30/2021   RX Diclegis 03/30/2021     Nightmares 10/08/2014   Supervision of high risk pregnancy, antepartum 03/23/2021              Nursing Staff    Provider      Office Location     [facility]    Dating     LMP      Language     English    Anatomy US     Normal but incomplete, f/u end of August 2022      Flu Vaccine     Received 07/08/21    Genetic/Carrier Screen     NIPS: Low risk female  AFP:  Not done  Horizon: neg      TDaP Vaccine      Declined 08/04/21    Hgb A1C or   GTT    Early   Third trimester       COVI   Supervision of other normal pregnancy, antepartum 09/03/2023              NURSING     PROVIDER      Office Location    [facility]    Dating  by    LMP c/w U/S at 8 wks      [facility] Model    Traditional    Anatomy U/S           Initiated care at     [facility]     English                     LAB RESULTS       Support Person         Genetics  NIPS:   AFP:                 NT/IT (FT only)                     Carrier Screen    Horizon:           Past Surgical History:  Procedure Laterality Date   EYE SURGERY      Family History  Problem Relation Age of Onset   Hypertension Mother    Seizures Father     Social History   Tobacco Use   Smoking status: Former    Types: Cigarettes   Smokeless tobacco: Never   Tobacco comments:    "As a kid", stopped when a teenage  Vaping Use   Vaping status: Never Used  Substance Use Topics   Alcohol use: Not Currently    Comment: rarely   Drug use: Not Currently    Types: Marijuana    Comment: several months ago    Allergies:  Allergies  Allergen Reactions   Cherry Swelling    Medications Prior to Admission  Medication Sig Dispense Refill Last Dose/Taking   ondansetron (ZOFRAN) 4 MG tablet Take 1 tablet (4 mg total) by mouth every 8 (eight) hours as needed for nausea or vomiting. 20 tablet 2 02/20/2024   Prenatal Vit-Fe Fumarate-FA (PREPLUS) 27-1 MG TABS Take 1 tablet by mouth daily. 30 tablet 13 Past Month   acetaminophen (TYLENOL) 325 MG tablet Take 2 tablets (650 mg total) by mouth every 6 (six) hours as needed. (Patient not taking: Reported on 11/27/2023) 30 tablet 0    aspirin EC 81 MG tablet Take 2 tablets (162 mg total) by mouth daily. Take after 12 weeks for prevention of preeclampsia later in pregnancy (Patient not taking: Reported on 02/07/2024) 300 tablet 2    Blood Pressure Monitoring (BLOOD PRESSURE KIT) DEVI 1 Device by Does not apply route once a week. (Patient not taking: Reported on 02/07/2024) 1 each 0    cyclobenzaprine (FLEXERIL) 5 MG tablet Take 1 tablet (5 mg total) by mouth 3 (three) times daily as needed for muscle spasms. (Patient not  taking: Reported on 02/07/2024) 20 tablet 0    Doxylamine-Pyridoxine (DICLEGIS) 10-10 MG TBEC Take 2 tablets by mouth at bedtime. If symptoms persist, add one tablet in the morning and one in the afternoon (Patient not taking: Reported on 02/07/2024) 100 tablet 5    metoCLOPramide (REGLAN) 10 MG tablet Take 1 tablet (10 mg total) by mouth 4 (four) times daily. (Patient not taking: Reported on 02/07/2024) 120 tablet 2    promethazine (PHENERGAN) 25 MG tablet Take 1 tablet (25 mg total) by mouth every 6 (six) hours as needed for nausea or vomiting. (Patient not taking: Reported on 02/07/2024) 30 tablet 2    scopolamine (TRANSDERM-SCOP) 1 MG/3DAYS Place 1 patch (1.5 mg total) onto the skin every 3 (three) days. (Patient not taking: Reported on 02/07/2024) 10 patch 0    terconazole (TERAZOL 3) 0.8 % vaginal cream Place 1 applicator vaginally at bedtime. (Patient not taking: Reported on 02/07/2024) 20 g 0     Review of Systems  Gastrointestinal:  Negative for abdominal pain.  Genitourinary:  Positive for vaginal discharge. Negative for vaginal bleeding and vaginal pain.  All other systems reviewed and are negative.  Physical Exam   Blood pressure 114/82, pulse 86, temperature 98.1 F (36.7 C), temperature source Oral, resp. rate 18, height 5\' 6"  (1.676 m), weight 60.8  kg, last menstrual period 07/06/2023, SpO2 100%, currently breastfeeding.  Physical Exam Vitals reviewed.  Constitutional:      Appearance: Normal appearance.  HENT:     Head: Normocephalic and atraumatic.     Nose: Nose normal.  Eyes:     Extraocular Movements: Extraocular movements intact.  Cardiovascular:     Rate and Rhythm: Normal rate.  Pulmonary:     Effort: Pulmonary effort is normal.  Abdominal:     Palpations: Abdomen is soft.     Comments: Gravid  Genitourinary:    Vagina: Vaginal discharge present.     Comments: SVE performed with chaperone, closed/thick/-3 Musculoskeletal:     Cervical back: Normal range of motion.   Neurological:     Mental Status: She is alert.     MAU Course  Procedures  MDM Wet prep NST  Assessment and Plan  Betty Logan is a 23 yo G2P1 @ [redacted]w[redacted]d presenting for concern for loss of mucous plug.  Vaginal discharge On exam thick vaginal discharge, SVE closed/thick/-3.  Wet prep collected which was negative.  Likely physiologic discharge of pregnancy.  NST was performed which she was reactive, normal baseline, accelerations present, no decelerations.  Patient had no other concerns or questions.  Patient discharged home with strict return precautions.  Betty Logan 02/21/2024, 5:49 PM

## 2024-02-21 NOTE — MAU Note (Signed)
 Betty Logan is a 23 y.o. at [redacted]w[redacted]d here in MAU reporting: she reports she lost her mucus plug today. She called her office before coming in and was transferred to a nurse line. She reports they advised her to come in to be seen. Endorses +FM, denies VB and LOF.    Onset of complaint: 4pm Pain score: denies Vitals:   02/21/24 1731  BP: 114/82  Pulse: 86  Resp: 18  Temp: 98.1 F (36.7 C)  SpO2: 100%     FHT: 145  Lab orders placed from triage: urine collected

## 2024-02-21 NOTE — Discharge Instructions (Signed)
 It was a pleasure taking care of you today.  You did have thicker discharge but it showed no yeast or BV.  This may normal physiologic discharge in pregnancy.  If you have any leaking of fluid, vaginal bleeding, regular contractions or you feel like your baby is not moving appropriately please return for further evaluation.  I hope you have a great day!

## 2024-02-25 ENCOUNTER — Other Ambulatory Visit: Payer: Self-pay

## 2024-02-25 ENCOUNTER — Ambulatory Visit: Admitting: Advanced Practice Midwife

## 2024-02-25 VITALS — BP 113/77 | HR 108 | Wt 137.1 lb

## 2024-02-25 DIAGNOSIS — O4403 Placenta previa specified as without hemorrhage, third trimester: Secondary | ICD-10-CM | POA: Diagnosis not present

## 2024-02-25 DIAGNOSIS — O4402 Placenta previa specified as without hemorrhage, second trimester: Secondary | ICD-10-CM

## 2024-02-25 DIAGNOSIS — O361930 Maternal care for other isoimmunization, third trimester, not applicable or unspecified: Secondary | ICD-10-CM

## 2024-02-25 DIAGNOSIS — O10913 Unspecified pre-existing hypertension complicating pregnancy, third trimester: Secondary | ICD-10-CM

## 2024-02-25 DIAGNOSIS — O361910 Maternal care for other isoimmunization, first trimester, not applicable or unspecified: Secondary | ICD-10-CM

## 2024-02-25 DIAGNOSIS — Z3480 Encounter for supervision of other normal pregnancy, unspecified trimester: Secondary | ICD-10-CM

## 2024-02-25 DIAGNOSIS — Z3A33 33 weeks gestation of pregnancy: Secondary | ICD-10-CM | POA: Diagnosis not present

## 2024-02-25 DIAGNOSIS — O10919 Unspecified pre-existing hypertension complicating pregnancy, unspecified trimester: Secondary | ICD-10-CM

## 2024-02-25 NOTE — Progress Notes (Signed)
   PRENATAL VISIT NOTE  Subjective:  Betty Logan is a 23 y.o. G2P1001 at [redacted]w[redacted]d being seen today for ongoing prenatal care.  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   Patient reports no complaints.  Contractions: Not present. Vag. Bleeding: None.  Movement: Present. Denies leaking of fluid.   The following portions of the patient's history were reviewed and updated as appropriate: allergies, current medications, past family history, past medical history, past social history, past surgical history and problem list.   Objective:   Vitals:   02/25/24 0830  BP: 113/77  Pulse: (!) 108  Weight: 137 lb 2 oz (62.2 kg)    Fetal Status: Fetal Heart Rate (bpm): 152 Fundal Height: 33 cm Movement: Present  Presentation: Vertex  General:  Alert, oriented and cooperative. Patient is in no acute distress.  Skin: Skin is warm and dry. No rash noted.   Cardiovascular: Normal heart rate noted  Respiratory: Normal respiratory effort, no problems with respiration noted  Abdomen: Soft, gravid, appropriate for gestational age.  Pain/Pressure: Absent     Pelvic: Cervical exam deferred        Extremities: Normal range of motion.  Edema: None  Mental Status: Normal mood and affect. Normal behavior. Normal judgment and thought content.   Assessment and Plan:  Pregnancy: G2P1001 at [redacted]w[redacted]d 1. history Chronic Hypertension affection pregnancy (Primary) - BP Nml  - Plan IOL at 39 weeks 2. Supervision of other normal pregnancy, antepartum  3. Anti-M isoimmunization affecting pregnancy in  first trimester - Negative this pregnancy  4. Placenta previa antepartum in second trimester - RESOLVED 5. [redacted] weeks gestation of pregnancy  Preterm labor symptoms and general obstetric precautions including but not limited to vaginal bleeding, contractions, leaking of fluid and fetal movement were reviewed in detail with the patient. Please refer to After Visit Summary for other counseling recommendations.   No follow-ups on file.  No future appointments.  Charnita Trudel  Felipe Horton, CNM [facility] [employer]

## 2024-03-10 ENCOUNTER — Ambulatory Visit: Admitting: Medical

## 2024-03-10 ENCOUNTER — Other Ambulatory Visit: Payer: Self-pay

## 2024-03-10 VITALS — BP 115/77 | HR 97 | Wt 147.2 lb

## 2024-03-10 DIAGNOSIS — Z3A35 35 weeks gestation of pregnancy: Secondary | ICD-10-CM

## 2024-03-10 DIAGNOSIS — O10912 Unspecified pre-existing hypertension complicating pregnancy, second trimester: Secondary | ICD-10-CM | POA: Diagnosis not present

## 2024-03-10 DIAGNOSIS — O10919 Unspecified pre-existing hypertension complicating pregnancy, unspecified trimester: Secondary | ICD-10-CM

## 2024-03-10 DIAGNOSIS — O361920 Maternal care for other isoimmunization, second trimester, not applicable or unspecified: Secondary | ICD-10-CM | POA: Diagnosis not present

## 2024-03-10 DIAGNOSIS — O361910 Maternal care for other isoimmunization, first trimester, not applicable or unspecified: Secondary | ICD-10-CM

## 2024-03-10 DIAGNOSIS — O4402 Placenta previa specified as without hemorrhage, second trimester: Secondary | ICD-10-CM

## 2024-03-10 DIAGNOSIS — Z3480 Encounter for supervision of other normal pregnancy, unspecified trimester: Secondary | ICD-10-CM

## 2024-03-11 NOTE — Progress Notes (Signed)
   PRENATAL VISIT NOTE  Subjective:  Betty Logan is a 23 y.o. G2P1001 at [redacted]w[redacted]d being seen today for ongoing prenatal care.  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  Patient reports occasional contractions.  Contractions: Not present.  .  Movement: Present. Denies leaking of fluid.   The following portions of the patient's history were reviewed and updated as appropriate: allergies, current medications, past family history, past medical history, past social history, past surgical history and problem list.   Objective:   Vitals:   03/10/24 1602  BP: 115/77  Pulse: 97  Weight: 147 lb 3.2 oz (66.8 kg)    Fetal Status: Fetal Heart Rate (bpm): 136 Fundal Height: 34 cm Movement: Present     General:  Alert, oriented and cooperative. Patient is in no acute distress.  Skin: Skin is warm and dry. No rash noted.   Cardiovascular: Normal heart rate noted  Respiratory: Normal respiratory effort, no problems with respiration noted  Abdomen: Soft, gravid, appropriate for gestational age.  Pain/Pressure: Present     Pelvic: Cervical exam deferred        Extremities: Normal range of motion.  Edema: None  Mental Status: Normal mood and affect. Normal behavior. Normal judgment and thought content.   Assessment and Plan:  Pregnancy: G2P1001 at [redacted]w[redacted]d 1. Placenta previa antepartum in second trimester (Primary) - Resolved   2. Supervision of other normal pregnancy, antepartum  - GBS, GC/CT at next visit   3. 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  history Chronic Hypertension affection pregnancy - schedule IOL at 39 weeks   5. [redacted] weeks gestation of pregnancy  Preterm labor symptoms and general obstetric precautions including but not limited to vaginal bleeding, contractions, leaking of fluid and fetal movement were reviewed in detail with the patient. Please refer to After Visit Summary for other counseling recommendations.   Return in about 1 week (around 03/17/2024) for LOB, In-Person, any provider.  Future Appointments  Date Time Provider Department Center  03/17/2024  3:55 PM Randel Buss, Mardee Shackle, MD [facility]    Army Landsman, PA-C

## 2024-03-11 NOTE — Progress Notes (Signed)
 IOL sched. For 04/04/24- am

## 2024-03-17 ENCOUNTER — Other Ambulatory Visit: Payer: Self-pay

## 2024-03-17 ENCOUNTER — Ambulatory Visit: Admitting: Family Medicine

## 2024-03-17 ENCOUNTER — Other Ambulatory Visit (HOSPITAL_COMMUNITY)
Admission: RE | Admit: 2024-03-17 | Discharge: 2024-03-17 | Disposition: A | Discharge status: Home / Self Care | Source: Ambulatory Visit | Attending: Family Medicine | Admitting: Family Medicine

## 2024-03-17 VITALS — BP 122/77 | HR 92 | Wt 145.0 lb

## 2024-03-17 DIAGNOSIS — O4403 Placenta previa specified as without hemorrhage, third trimester: Secondary | ICD-10-CM | POA: Diagnosis not present

## 2024-03-17 DIAGNOSIS — O10919 Unspecified pre-existing hypertension complicating pregnancy, unspecified trimester: Secondary | ICD-10-CM

## 2024-03-17 DIAGNOSIS — O4402 Placenta previa specified as without hemorrhage, second trimester: Secondary | ICD-10-CM

## 2024-03-17 DIAGNOSIS — Z3A36 36 weeks gestation of pregnancy: Secondary | ICD-10-CM

## 2024-03-17 DIAGNOSIS — O10913 Unspecified pre-existing hypertension complicating pregnancy, third trimester: Secondary | ICD-10-CM

## 2024-03-17 DIAGNOSIS — Z3480 Encounter for supervision of other normal pregnancy, unspecified trimester: Secondary | ICD-10-CM

## 2024-03-17 NOTE — Progress Notes (Signed)
 Subjective:  Betty Logan is a 23 y.o. G2P1001 at [redacted]w[redacted]d being seen today for prenatal care.  Patient reports backache, no bleeding, no contractions, and no cramping.  Contractions: Not present.  Vag. Bleeding: None. Movement: Present. Pt states she has a history of placenta previa found during this pregnancy in the second trimester; however, US  has revealed that has since resolved. Denies leaking of fluid. Pt endorses intermittent diarrhea. Pt denies any leg swelling or blurry vision. Pt intends to bottle feed.   The following portions of the patient's history were reviewed and updated as appropriate: allergies, current medications, past family history, past medical history, past social history, past surgical history and problem list.   Objective:   Vitals:   03/17/24 1604  BP: 122/77  Pulse: 92  Weight: 145 lb (65.8 kg)    Fetal Status: Fetal Heart Rate (bpm): 147   Movement: Present     General:  Alert, oriented and cooperative. Patient is in no acute distress.  Skin: Skin is warm and dry. No rash noted.   Cardiovascular: Normal heart rate noted  Respiratory: Normal respiratory effort, no problems with respiration noted  Abdomen: Soft, gravid, appropriate for gestational age. Pain/Pressure: Absent     Vaginal: Vag. Bleeding: None.    Vag D/C Character: Other (Comment) (MUCUS PLUG)  Cervix: Not evaluated        Extremities: Normal range of motion.  Edema: None  Mental Status: Normal mood and affect. Normal behavior. Normal judgment and thought content.   Urinalysis:      Assessment and Plan:  Pregnancy: G2P1001 at [redacted]w[redacted]d  Chronic hypertension affecting pregnancy Pt states she has a family history of chronic hypertension. She states that she has never had to take medication for her hypertension. Pt was encouraged to monitor blood pressures leading up to delivery. Pt was informed of the warning signs of possible pre-eclampsia.   2. Placenta previa antepartum in second  trimester This finding on US  has since resolved. Pt was educated on the potential signs and symptoms of placenta previa.   3. Supervision of other normal pregnancy, antepartum, third trimester Pt has otherwise normal pregnancy at this time.  GBS and G/C swabs were completed in office today.   Term labor symptoms and general obstetric precautions including but not limited to vaginal bleeding, contractions, leaking of fluid and fetal movement were reviewed in detail with the patient. Please refer to After Visit Summary for other counseling recommendations.  No follow-ups on file.   Vernida Goodie, Student-PA   Attending Attestation  I saw and evaluated the patient, performing the key elements of the service.I  personally performed or re-performed the history, physical exam, and medical decision making activities of this service and have verified that the service and findings are accurately documented in the student's note. I developed the management plan that is described in the student's note, and I agree with the content, with my edits above.    Janna Melter, MD Attending Family Medicine Physician, [facility] for [facility], [facility]

## 2024-03-19 LAB — GC/CHLAMYDIA PROBE AMP (~~LOC~~) NOT AT ARMC
Chlamydia: NEGATIVE
Comment: NEGATIVE
Comment: NORMAL
Neisseria Gonorrhea: NEGATIVE

## 2024-03-21 LAB — CULTURE, BETA STREP (GROUP B ONLY): Strep Gp B Culture: NEGATIVE

## 2024-03-26 ENCOUNTER — Other Ambulatory Visit: Payer: Self-pay

## 2024-03-26 ENCOUNTER — Ambulatory Visit: Admitting: Obstetrics & Gynecology

## 2024-03-26 ENCOUNTER — Encounter: Payer: Self-pay | Admitting: Obstetrics & Gynecology

## 2024-03-26 VITALS — BP 114/73 | HR 89 | Wt 143.1 lb

## 2024-03-26 DIAGNOSIS — O09892 Supervision of other high risk pregnancies, second trimester: Secondary | ICD-10-CM

## 2024-03-26 DIAGNOSIS — O10919 Unspecified pre-existing hypertension complicating pregnancy, unspecified trimester: Secondary | ICD-10-CM

## 2024-03-26 DIAGNOSIS — Z3A37 37 weeks gestation of pregnancy: Secondary | ICD-10-CM | POA: Diagnosis not present

## 2024-03-26 DIAGNOSIS — Z3480 Encounter for supervision of other normal pregnancy, unspecified trimester: Secondary | ICD-10-CM

## 2024-03-26 NOTE — Progress Notes (Signed)
   PRENATAL VISIT NOTE  Subjective:  Betty Logan is a 23 y.o. G2P1001 at [redacted]w[redacted]d being seen today for ongoing prenatal care.  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  Patient reports occasional contractions.  Contractions: Irritability. Vag. Bleeding: None.  Movement: Present. Denies leaking of fluid.   The following portions of the patient's history were reviewed and updated as appropriate: allergies, current medications, past family history, past medical history, past social history, past surgical history and problem list.   Objective:    Vitals:   03/26/24 1453  BP: 114/73  Pulse: 89  Weight: 143 lb 1.6 oz (64.9 kg)    Fetal Status:  Fetal Heart Rate (bpm): 141   Movement: Present Presentation: Vertex  General: Alert, oriented and cooperative. Patient is in no acute distress.  Skin: Skin is warm and dry. No rash noted.   Cardiovascular: Normal heart rate noted  Respiratory: Normal respiratory effort, no problems with respiration noted  Abdomen: Soft, gravid, appropriate for gestational age.  Pain/Pressure: Absent     Pelvic: Cervical exam performed in the presence of a chaperone Dilation: 1 Effacement (%): 40 Station: -3  Extremities: Normal range of motion.  Edema: None  Mental Status: Normal mood and affect. Normal behavior. Normal judgment and thought content.   Assessment and Plan:  Pregnancy: G2P1001 at [redacted]w[redacted]d 1. history Chronic Hypertension affection pregnancy (Primary) Normal BP  2. Supervision of other normal pregnancy, antepartum IOL at 39 weeks  Term labor symptoms and general obstetric  precautions including but not limited to vaginal bleeding, contractions, leaking of fluid and fetal movement were reviewed in detail with the patient. Please refer to After Visit Summary for other counseling recommendations.   Return in about 1 week (around 04/02/2024).  Future Appointments  Date Time Provider Department Center  04/04/2024  6:45 AM [facility] SCHED ROOM [facility] None    Onnie Bilis, MD

## 2024-04-01 ENCOUNTER — Ambulatory Visit (INDEPENDENT_AMBULATORY_CARE_PROVIDER_SITE_OTHER): Admitting: Obstetrics and Gynecology

## 2024-04-01 ENCOUNTER — Encounter: Payer: Self-pay | Admitting: Obstetrics and Gynecology

## 2024-04-01 ENCOUNTER — Other Ambulatory Visit: Payer: Self-pay

## 2024-04-01 VITALS — BP 121/85 | HR 90 | Wt 148.0 lb

## 2024-04-01 DIAGNOSIS — Z3A38 38 weeks gestation of pregnancy: Secondary | ICD-10-CM | POA: Diagnosis not present

## 2024-04-01 DIAGNOSIS — O10013 Pre-existing essential hypertension complicating pregnancy, third trimester: Secondary | ICD-10-CM

## 2024-04-01 DIAGNOSIS — O10919 Unspecified pre-existing hypertension complicating pregnancy, unspecified trimester: Secondary | ICD-10-CM

## 2024-04-01 DIAGNOSIS — Z3480 Encounter for supervision of other normal pregnancy, unspecified trimester: Secondary | ICD-10-CM

## 2024-04-01 NOTE — Progress Notes (Signed)
   PRENATAL VISIT NOTE  Subjective:  Betty Logan is a 23 y.o. G2P1001 at [redacted]w[redacted]d being seen today for ongoing prenatal care.  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  Patient reports contractions in the am. Otherwise feeling well. Contractions: Regular. Vag. Bleeding: None.  Movement: Present. Denies leaking of fluid.   The following portions of the patient's history were reviewed and updated as appropriate: allergies, current medications, past family history, past medical history, past social history, past surgical history and problem list.   Objective:    Vitals:   04/01/24 0911  BP: 121/85  Pulse: 90  Weight: 148 lb (67.1 kg)    Fetal Status:  Fetal Heart Rate (bpm): 138   Movement: Present    General: Alert, oriented and cooperative. Patient is in no acute distress.  Skin: Skin is warm and dry. No rash noted.   Cardiovascular: Normal heart rate noted  Respiratory: Normal respiratory effort, no problems with respiration noted  Abdomen: Soft, gravid, appropriate for gestational age.  Pain/Pressure: Present   cephalic by palpation  Pelvic: Cervical exam deferred        Extremities: Normal range of motion.  Edema: None  Mental Status: Normal mood and affect. Normal behavior. Normal judgment and thought content.   Assessment and Plan:  Pregnancy: G2P1001 at [redacted]w[redacted]d  1. [redacted] weeks gestation of pregnancy (Primary)  2. Supervision of other normal pregnancy, antepartum  3. history Chronic Hypertension affection pregnancy Normal today Has IOL scheduled for 5/30 Reviewed precautions   Term labor symptoms and  general obstetric precautions including but not limited to vaginal bleeding, contractions, leaking of fluid and fetal movement were reviewed in detail with the patient. Please refer to After Visit Summary for other counseling recommendations.   Return for post partum check.  Future Appointments  Date Time Provider Department Center  04/04/2024  6:45 AM [facility] SCHED ROOM [facility] None  04/18/2024  9:00 AM [facility] NURSE [facility]  05/06/2024  1:35 PM Delfina Feller, [place] [facility]    Jan Mcgill, MD

## 2024-04-03 ENCOUNTER — Telehealth (HOSPITAL_COMMUNITY): Payer: Self-pay | Admitting: *Deleted

## 2024-04-03 NOTE — Telephone Encounter (Signed)
 Preadmission screen

## 2024-04-04 ENCOUNTER — Inpatient Hospital Stay (HOSPITAL_COMMUNITY): Admitting: Anesthesiology

## 2024-04-04 ENCOUNTER — Inpatient Hospital Stay (HOSPITAL_COMMUNITY)

## 2024-04-04 ENCOUNTER — Inpatient Hospital Stay (HOSPITAL_COMMUNITY)
Admission: RE | Admit: 2024-04-04 | Discharge: 2024-04-07 | DRG: 807 | Disposition: A | Discharge status: Home / Self Care | Payer: Medicare Other | Attending: Obstetrics and Gynecology | Admitting: Obstetrics and Gynecology

## 2024-04-04 ENCOUNTER — Other Ambulatory Visit: Payer: Self-pay

## 2024-04-04 ENCOUNTER — Encounter (HOSPITAL_COMMUNITY): Payer: Self-pay | Admitting: Obstetrics and Gynecology

## 2024-04-04 DIAGNOSIS — O10919 Unspecified pre-existing hypertension complicating pregnancy, unspecified trimester: Secondary | ICD-10-CM | POA: Diagnosis present

## 2024-04-04 DIAGNOSIS — Z3A39 39 weeks gestation of pregnancy: Secondary | ICD-10-CM

## 2024-04-04 DIAGNOSIS — O1002 Pre-existing essential hypertension complicating childbirth: Secondary | ICD-10-CM | POA: Diagnosis not present

## 2024-04-04 DIAGNOSIS — F331 Major depressive disorder, recurrent, moderate: Secondary | ICD-10-CM | POA: Diagnosis present

## 2024-04-04 DIAGNOSIS — Z87891 Personal history of nicotine dependence: Secondary | ICD-10-CM | POA: Diagnosis not present

## 2024-04-04 DIAGNOSIS — Z3480 Encounter for supervision of other normal pregnancy, unspecified trimester: Secondary | ICD-10-CM

## 2024-04-04 DIAGNOSIS — Z2839 Other underimmunization status: Secondary | ICD-10-CM

## 2024-04-04 DIAGNOSIS — Z8249 Family history of ischemic heart disease and other diseases of the circulatory system: Secondary | ICD-10-CM | POA: Diagnosis not present

## 2024-04-04 DIAGNOSIS — O1092 Unspecified pre-existing hypertension complicating childbirth: Principal | ICD-10-CM | POA: Diagnosis present

## 2024-04-04 DIAGNOSIS — O9962 Diseases of the digestive system complicating childbirth: Secondary | ICD-10-CM | POA: Diagnosis present

## 2024-04-04 DIAGNOSIS — O09899 Supervision of other high risk pregnancies, unspecified trimester: Secondary | ICD-10-CM

## 2024-04-04 DIAGNOSIS — K219 Gastro-esophageal reflux disease without esophagitis: Secondary | ICD-10-CM | POA: Diagnosis present

## 2024-04-04 DIAGNOSIS — Z23 Encounter for immunization: Secondary | ICD-10-CM | POA: Diagnosis not present

## 2024-04-04 DIAGNOSIS — F4310 Post-traumatic stress disorder, unspecified: Secondary | ICD-10-CM | POA: Diagnosis present

## 2024-04-04 DIAGNOSIS — O361910 Maternal care for other isoimmunization, first trimester, not applicable or unspecified: Secondary | ICD-10-CM | POA: Diagnosis present

## 2024-04-04 DIAGNOSIS — Z3490 Encounter for supervision of normal pregnancy, unspecified, unspecified trimester: Principal | ICD-10-CM | POA: Diagnosis present

## 2024-04-04 LAB — CBC
HCT: 33.5 % — ABNORMAL LOW (ref 36.0–46.0)
Hemoglobin: 11.6 g/dL — ABNORMAL LOW (ref 12.0–15.0)
MCH: 31.4 pg (ref 26.0–34.0)
MCHC: 34.6 g/dL (ref 30.0–36.0)
MCV: 90.8 fL (ref 80.0–100.0)
Platelets: 280 10*3/uL (ref 150–400)
RBC: 3.69 MIL/uL — ABNORMAL LOW (ref 3.87–5.11)
RDW: 13.2 % (ref 11.5–15.5)
WBC: 8.3 10*3/uL (ref 4.0–10.5)
nRBC: 0 % (ref 0.0–0.2)

## 2024-04-04 LAB — CBC WITH DIFFERENTIAL/PLATELET
Abs Immature Granulocytes: 0.1 10*3/uL — ABNORMAL HIGH (ref 0.00–0.07)
Basophils Absolute: 0 10*3/uL (ref 0.0–0.1)
Basophils Relative: 0 %
Eosinophils Absolute: 0.2 10*3/uL (ref 0.0–0.5)
Eosinophils Relative: 2 %
HCT: 35 % — ABNORMAL LOW (ref 36.0–46.0)
Hemoglobin: 12 g/dL (ref 12.0–15.0)
Immature Granulocytes: 1 %
Lymphocytes Relative: 16 %
Lymphs Abs: 1.7 10*3/uL (ref 0.7–4.0)
MCH: 31.1 pg (ref 26.0–34.0)
MCHC: 34.3 g/dL (ref 30.0–36.0)
MCV: 90.7 fL (ref 80.0–100.0)
Monocytes Absolute: 1.1 10*3/uL — ABNORMAL HIGH (ref 0.1–1.0)
Monocytes Relative: 11 %
Neutro Abs: 7.3 10*3/uL (ref 1.7–7.7)
Neutrophils Relative %: 70 %
Platelets: 274 10*3/uL (ref 150–400)
RBC: 3.86 MIL/uL — ABNORMAL LOW (ref 3.87–5.11)
RDW: 13.2 % (ref 11.5–15.5)
WBC: 10.4 10*3/uL (ref 4.0–10.5)
nRBC: 0 % (ref 0.0–0.2)

## 2024-04-04 LAB — COMPREHENSIVE METABOLIC PANEL WITH GFR
ALT: 10 U/L (ref 0–44)
AST: 15 U/L (ref 15–41)
Albumin: 3 g/dL — ABNORMAL LOW (ref 3.5–5.0)
Alkaline Phosphatase: 109 U/L (ref 38–126)
Anion gap: 9 (ref 5–15)
BUN: 9 mg/dL (ref 6–20)
CO2: 18 mmol/L — ABNORMAL LOW (ref 22–32)
Calcium: 8.5 mg/dL — ABNORMAL LOW (ref 8.9–10.3)
Chloride: 106 mmol/L (ref 98–111)
Creatinine, Ser: 0.46 mg/dL (ref 0.44–1.00)
GFR, Estimated: >60 mL/min (ref >60)
Glucose, Bld: 87 mg/dL (ref 70–99)
Potassium: 3.5 mmol/L (ref 3.5–5.1)
Sodium: 133 mmol/L — ABNORMAL LOW (ref 135–145)
Total Bilirubin: 0.6 mg/dL (ref 0.0–1.2)
Total Protein: 6.5 g/dL (ref 6.5–8.1)

## 2024-04-04 LAB — RPR: RPR Ser Ql: NONREACTIVE

## 2024-04-04 MED ORDER — ONDANSETRON HCL 4 MG/2ML IJ SOLN: 4.0000 mg | Freq: Four times a day (QID) | INTRAMUSCULAR | Status: DC | PRN

## 2024-04-04 MED ORDER — LIDOCAINE HCL (PF) 1 % IJ SOLN
INTRAMUSCULAR | Status: DC | PRN
  Administered 2024-04-04 (×2): 5 mL via EPIDURAL

## 2024-04-04 MED ORDER — LACTATED RINGERS IV SOLN
500.0000 mL | Freq: Once | INTRAVENOUS | Status: AC
  Administered 2024-04-04: 500 mL via INTRAVENOUS

## 2024-04-04 MED ORDER — OXYTOCIN-SODIUM CHLORIDE 30-0.9 UT/500ML-% IV SOLN
1.0000 m[IU]/min | INTRAVENOUS | Status: DC
  Administered 2024-04-04 – 2024-04-05 (×2): 2 m[IU]/min via INTRAVENOUS

## 2024-04-04 MED ORDER — LACTATED RINGERS IV SOLN
500.0000 mL | INTRAVENOUS | Status: DC | PRN
  Administered 2024-04-04: 500 mL via INTRAVENOUS

## 2024-04-04 MED ORDER — OXYCODONE-ACETAMINOPHEN 5-325 MG PO TABS: 2.0000 | ORAL_TABLET | ORAL | Status: DC | PRN

## 2024-04-04 MED ORDER — DIPHENHYDRAMINE HCL 50 MG/ML IJ SOLN: 12.5000 mg | INTRAMUSCULAR | Status: DC | PRN

## 2024-04-04 MED ORDER — MISOPROSTOL 50MCG HALF TABLET
50.0000 ug | ORAL_TABLET | Freq: Once | ORAL | Status: AC
  Administered 2024-04-04: 50 ug via ORAL
  Filled 2024-04-04: qty 1

## 2024-04-04 MED ORDER — FENTANYL CITRATE (PF) 100 MCG/2ML IJ SOLN
50.0000 ug | INTRAMUSCULAR | Status: DC | PRN
  Administered 2024-04-04: 100 ug via INTRAVENOUS
  Filled 2024-04-04 (×2): qty 2

## 2024-04-04 MED ORDER — EPHEDRINE 5 MG/ML INJ: 10.0000 mg | INTRAVENOUS | Status: DC | PRN

## 2024-04-04 MED ORDER — PHENYLEPHRINE 80 MCG/ML (10ML) SYRINGE FOR IV PUSH (FOR BLOOD PRESSURE SUPPORT): 80.0000 ug | PREFILLED_SYRINGE | INTRAVENOUS | Status: DC | PRN

## 2024-04-04 MED ORDER — MISOPROSTOL 25 MCG QUARTER TABLET
25.0000 ug | ORAL_TABLET | Freq: Once | ORAL | Status: AC
  Administered 2024-04-04: 25 ug via VAGINAL
  Filled 2024-04-04: qty 1

## 2024-04-04 MED ORDER — LIDOCAINE HCL (PF) 1 % IJ SOLN: 30.0000 mL | INTRAMUSCULAR | Status: DC | PRN

## 2024-04-04 MED ORDER — LACTATED RINGERS IV SOLN: INTRAVENOUS | Status: DC

## 2024-04-04 MED ORDER — TERBUTALINE SULFATE 1 MG/ML IJ SOLN: 0.2500 mg | Freq: Once | INTRAMUSCULAR | Status: DC | PRN

## 2024-04-04 MED ORDER — OXYCODONE-ACETAMINOPHEN 5-325 MG PO TABS: 1.0000 | ORAL_TABLET | ORAL | Status: DC | PRN

## 2024-04-04 MED ORDER — OXYTOCIN BOLUS FROM INFUSION
333.0000 mL | Freq: Once | INTRAVENOUS | Status: AC
  Administered 2024-04-05: 333 mL via INTRAVENOUS

## 2024-04-04 MED ORDER — ACETAMINOPHEN 325 MG PO TABS
650.0000 mg | ORAL_TABLET | ORAL | Status: DC | PRN
  Administered 2024-04-04: 650 mg via ORAL
  Filled 2024-04-04: qty 2

## 2024-04-04 MED ORDER — FENTANYL-BUPIVACAINE-NACL 0.5-0.125-0.9 MG/250ML-% EP SOLN
12.0000 mL/h | EPIDURAL | Status: DC | PRN
  Administered 2024-04-04: 12 mL/h via EPIDURAL
  Filled 2024-04-04 (×2): qty 250

## 2024-04-04 MED ORDER — OXYTOCIN-SODIUM CHLORIDE 30-0.9 UT/500ML-% IV SOLN
2.5000 [IU]/h | INTRAVENOUS | Status: DC
  Administered 2024-04-05: 2.5 [IU]/h via INTRAVENOUS
  Filled 2024-04-04: qty 500

## 2024-04-04 MED ORDER — SOD CITRATE-CITRIC ACID 500-334 MG/5ML PO SOLN: 30.0000 mL | ORAL | Status: DC | PRN

## 2024-04-04 NOTE — Progress Notes (Addendum)
 Labor Progress Note Betty Logan is a 23 y.o. G2P1001 at [redacted]w[redacted]d presented for IOL 2/2 to [place].  S: Patient is resting comfortably in bed with a family friend at beside. She endorses occasional gushes of fluid from prior SROM and states her mucus plug may have come out after going to the bathroom. Her pain in well-managed with IV pain meds and endorses contractions.  O:  BP 121/82   Pulse 88   Temp 98 F (36.7 C) (Oral)   Resp 16   Ht 5\' 6"  (1.676 m)   Wt 66.3 kg   LMP 07/06/2023 (Exact Date)   SpO2 100%   BMI 23.58 kg/m  EFM: 145/mod/+a/-d  CVE: Dilation: 6 Effacement (%): 70 Cervical Position: Middle Station: -1 Presentation: Vertex Exam by:: [name], RN   A&P: 23 y.o. G2P1001 [redacted]w[redacted]d G2P1001 [redacted]w[redacted]d admitted for IOL 2/2 cHTN.  #Labor: Progressing well. S/p SROM. Continue Pitocin . #Pain: IV pain meds as needed. No epidural. #FWB: Category I  #GBS negative #Chronic HTN: no medical management during pregnancy. No s/sx of SIPE. BP is stable at 120s/ mid 80s range.    Darryl Endow, Student-PA 6:42 PM  Attestation of Supervision of Student:  I confirm that I have verified the information documented in the physician assistant student's note and that I have also personally reperformed the history, physical exam and all medical decision making activities.  I have verified that all services and findings are accurately documented in this student's note; and I agree with management and plan as outlined in the documentation. I have also made any necessary editorial changes.   Melanie Spires, MD Center for [facility], [facility] 04/04/2024 7:21 PM

## 2024-04-04 NOTE — Anesthesia Procedure Notes (Signed)
 Epidural Patient location during procedure: OB Start time: 04/04/2024 11:04 PM End time: 04/04/2024 11:12 PM  Staffing Anesthesiologist: Tura Gaines, MD Performed: anesthesiologist   Preanesthetic Checklist Completed: patient identified, IV checked, site marked, risks and benefits discussed, surgical consent, monitors and equipment checked, pre-op evaluation and timeout performed  Epidural Patient position: sitting Prep: DuraPrep and site prepped and draped Patient monitoring: continuous pulse ox and blood pressure Approach: midline Location: L3-L4 Injection technique: LOR air  Needle:  Needle type: Tuohy  Needle gauge: 17 G Needle length: 9 cm and 9 Needle insertion depth: 5 cm Catheter type: closed end flexible Catheter size: 19 Gauge Catheter at skin depth: 10 cm Test dose: negative and Other  Assessment Events: blood not aspirated, no cerebrospinal fluid, injection not painful, no injection resistance, no paresthesia and negative IV test  Additional Notes Patient identified. Risks and benefits discussed including failed block, incomplete  Pain control, post dural puncture headache, nerve damage, paralysis, blood pressure Changes, nausea, vomiting, reactions to medications-both toxic and allergic and post Partum back pain. All questions were answered. Patient expressed understanding and wished to proceed. Sterile technique was used throughout procedure. Epidural site was Dressed with sterile barrier dressing. No paresthesias, signs of intravascular injection Or signs of intrathecal spread were encountered.  Patient was more comfortable after the epidural was dosed. Please see RN's note for documentation of vital signs and FHR which are stable. Reason for block:procedure for pain

## 2024-04-04 NOTE — Progress Notes (Signed)
 Labor Progress Note Betty Logan is a 23 y.o. G2P1001 at [redacted]w[redacted]d presented for IOL 2/2 cHTN.  S: Patient is resting comfortably. Reports water broke spontaneously. Is feeling contractions. Pain manageable at this time with IV pain meds.  O:  BP 125/77   Pulse 89   Temp 98 F (36.7 C) (Oral)   Resp 16   Ht 5\' 6"  (1.676 m)   Wt 66.3 kg   LMP 07/06/2023 (Exact Date)   SpO2 99%   BMI 23.58 kg/m  General: No acute distress. Breathing comfortably on RA.  EFM: baseline 130/accels present/decels absent   CVE: Dilation: 3 Effacement (%): 50 Station: -2 Presentation: Vertex Exam by:: [name], RN   A&P: 23 y.o. G2P1001 [redacted]w[redacted]d admitted for IOL 2/2 cHTN.  #Labor: Progressing well. S/p SROM.  #Pain: IV pain meds PRN #FHT: Cat 1  #GBS negative  #cHTN: No medications during pregnancy. No s/sx of SIPE on admission. BP stable at this time.   Carey Chapman, MD Center for [facility], [facility] 10:13 AM

## 2024-04-04 NOTE — Progress Notes (Signed)
 LABOR PROGRESS NOTE  Patient Name: Betty Logan, female   DOB: 11/23/2000, 23 y.o.  MRN: [id number]  Patient starting to feel significantly more intense contractions. Using nitrous and getting set up for an epidural. Remains reassuring Cat II with shallow variables, moderate variability, and accels. Anticipate NSVD.  Maud Sorenson, MD

## 2024-04-04 NOTE — Anesthesia Preprocedure Evaluation (Signed)
 Anesthesia Evaluation  Patient identified by MRN, date of birth, ID band Patient awake    Reviewed: Allergy & Precautions, NPO status , Patient's Chart, lab work & pertinent test results  History of Anesthesia Complications (+) history of anesthetic complications  Airway Mallampati: II  TM Distance: >3 FB Neck ROM: Full    Dental no notable dental hx. (+) Teeth Intact   Pulmonary neg pulmonary ROS, former smoker Covid positive, symptoms include sore throat   Pulmonary exam normal breath sounds clear to auscultation       Cardiovascular hypertension, Normal cardiovascular exam Rhythm:Regular Rate:Normal     Neuro/Psych  PSYCHIATRIC DISORDERS Anxiety Depression    negative neurological ROS     GI/Hepatic negative GI ROS, Neg liver ROS,GERD  ,,  Endo/Other  negative endocrine ROS    Renal/GU negative Renal ROS  negative genitourinary   Musculoskeletal negative musculoskeletal ROS (+)    Abdominal   Peds  Hematology negative hematology ROS (+)   Anesthesia Other Findings IOL for cHTN  Reproductive/Obstetrics (+) Pregnancy                              Anesthesia Physical Anesthesia Plan  ASA: 3  Anesthesia Plan: Epidural   Post-op Pain Management:    Induction:   PONV Risk Score and Plan: Treatment may vary due to age or medical condition  Airway Management Planned: Natural Airway  Additional Equipment: Fetal Monitoring and None  Intra-op Plan:   Post-operative Plan:   Informed Consent: I have reviewed the patients History and Physical, chart, labs and discussed the procedure including the risks, benefits and alternatives for the proposed anesthesia with the patient or authorized representative who has indicated his/her understanding and acceptance.       Plan Discussed with: Anesthesiologist  Anesthesia Plan Comments: (Patient identified. Risks, benefits, options  discussed with patient including but not limited to bleeding, infection, nerve damage, paralysis, failed block, incomplete pain control, headache, blood pressure changes, nausea, vomiting, reactions to medication, itching, and post partum back pain. Confirmed with bedside nurse the patient's most recent platelet count. Confirmed with the patient that they are not taking any anticoagulation, have any bleeding history or any family history of bleeding disorders. Patient expressed understanding and wishes to proceed. All questions were answered. )         Anesthesia Quick Evaluation

## 2024-04-04 NOTE — H&P (Signed)
 LABOR AND DELIVERY ADMISSION HISTORY AND PHYSICAL NOTE  Betty Logan is a 23 y.o. female G2P1001 with IUP at [redacted]w[redacted]d presenting for IOL 2/2 cHTN.   Patient reports the fetal movement as active. Patient reports uterine contraction activity as none. Patient reports vaginal bleeding as none. Patient describes fluid per vagina as None.   Patient denies headache, vision changes, chest pain, shortness of breath, right upper quadrant pain, or LE edema.  She plans on bottle feeding. Her contraception plan is: no method.  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 Was also negative one year ago, but positive 11/2018.     04/06/2021: Called and left VM for Tammy Faucet, TB control [facility] to discuss patient diagnosis and treatment. Per Dr. Ilona Malta, with negative Quantiferon-Gold, pt does not need any additional work-up for TB at this time.        Nausea and vomiting in pregnancy 03/30/2021   RX Diclegis   03/30/2021     Nightmares 10/08/2014   Supervision of high risk pregnancy, antepartum 03/23/2021              Nursing Staff    Provider      Office Location     [facility]    Dating     LMP      Language     English    Anatomy US      Normal but incomplete, f/u end of August 2022      Flu Vaccine     Received 07/08/21    Genetic/Carrier Screen     NIPS: Low risk female  AFP:  Not done  Horizon: neg      TDaP Vaccine      Declined 08/04/21    Hgb A1C or   GTT    Early   Third trimester       COVI   Supervision of other normal pregnancy, antepartum 09/03/2023              NURSING     PROVIDER      Office Location    [facility]    Dating by    LMP c/w U/S at 8 wks      [facility] Model    Traditional    Anatomy U/S           Initiated care at     [facility]  LAB RESULTS       Support Person         Genetics    NIPS:   AFP:                 NT/IT (FT only)                     Carrier Screen    Horizon:           Past Surgical History: Past Surgical History:  Procedure Laterality Date   EYE SURGERY      Obstetrical History: OB History     Gravida  2   Para  1   Term  1   Preterm  0   AB  0   Living  1      SAB  0   IAB  0   Ectopic  0   Multiple  0   Live Births  1           Social History: Social History   Socioeconomic History   Marital status: Single    Spouse name: Not on file   Number of children: Not on file   Years of education: Not on file   Highest education level: Not on file  Occupational History   Not on file  Tobacco Use   Smoking status: Former    Types: Cigarettes   Smokeless tobacco: Never   Tobacco comments:    "As a kid", stopped when a teenage  Vaping Use   Vaping status: Never Used  Substance and Sexual Activity   Alcohol use: Not Currently    Comment: rarely   Drug use: Not Currently    Types: Marijuana    Comment: several months ago   Sexual activity: Not Currently    Partners: Male     Birth control/protection: None  Other Topics Concern   Not on file  Social History Narrative   Not on file   Social Drivers of Health   Financial Resource Strain: Not on file  Food Insecurity: No Food Insecurity (04/04/2024)   Hunger Vital Sign    Worried About Running Out of Food in the Last Year: Never true    Ran Out of Food in the Last Year: Never true  Transportation Needs: No Transportation Needs (04/04/2024)   PRAPARE - Administrator, Civil Service (Medical): No    Lack of Transportation (Non-Medical): No  Physical Activity: Not on file  Stress: Not on file  Social Connections: Not on file    Family History: Family History  Problem Relation Age of Onset   Hypertension Mother    Seizures Father     Allergies: Allergies  Allergen Reactions   Cherry Swelling    Medications Prior to Admission  Medication Sig Dispense Refill Last Dose/Taking   acetaminophen  (TYLENOL ) 325 MG tablet Take 2 tablets (650 mg total) by mouth every 6 (six) hours as needed. (Patient not taking: Reported on 04/01/2024) 30 tablet 0    aspirin  EC 81 MG tablet Take 2 tablets (162 mg total) by mouth daily. Take after 12 weeks for prevention of preeclampsia later in pregnancy (Patient not taking: Reported on 11/16/2023) 300 tablet 2    Blood Pressure Monitoring (BLOOD PRESSURE KIT) DEVI 1 Device by Does not apply route once a week. (Patient not taking: Reported on 09/19/2023) 1 each 0    cyclobenzaprine  (FLEXERIL ) 5 MG tablet Take 1 tablet (5 mg total) by mouth 3 (  three) times daily as needed for muscle spasms. (Patient not taking: Reported on 04/01/2024) 20 tablet 0    Doxylamine -Pyridoxine  (DICLEGIS ) 10-10 MG TBEC Take 2 tablets by mouth at bedtime. If symptoms persist, add one tablet in the morning and one in the afternoon (Patient not taking: Reported on 11/16/2023) 100 tablet 5    metoCLOPramide  (REGLAN ) 10 MG tablet Take 1 tablet (10 mg total) by mouth 4 (four) times daily. (Patient not  taking: Reported on 11/16/2023) 120 tablet 2    ondansetron  (ZOFRAN ) 4 MG tablet Take 1 tablet (4 mg total) by mouth every 8 (eight) hours as needed for nausea or vomiting. (Patient not taking: Reported on 02/25/2024) 20 tablet 2    Prenatal Vit-Fe Fumarate-FA (PREPLUS) 27-1 MG TABS Take 1 tablet by mouth daily. (Patient not taking: Reported on 04/01/2024) 30 tablet 13    promethazine  (PHENERGAN ) 25 MG tablet Take 1 tablet (25 mg total) by mouth every 6 (six) hours as needed for nausea or vomiting. (Patient not taking: Reported on 09/03/2023) 30 tablet 2    scopolamine  (TRANSDERM-SCOP) 1 MG/3DAYS Place 1 patch (1.5 mg total) onto the skin every 3 (three) days. (Patient not taking: Reported on 11/16/2023) 10 patch 0    terconazole  (TERAZOL 3 ) 0.8 % vaginal cream Place 1 applicator vaginally at bedtime. (Patient not taking: Reported on 04/01/2024) 20 g 0      Review of Systems  All systems reviewed and negative except as stated in HPI  Physical Exam BP 116/88   Pulse 89   Temp 98 F (36.7 C) (Oral)   Resp 19   Ht 5\' 6"  (1.676 m)   Wt 66.3 kg   LMP 07/06/2023 (Exact Date)   SpO2 99%   BMI 23.58 kg/m   Physical Exam Constitutional:      General: She is not in acute distress.    Appearance: She is not ill-appearing.  Cardiovascular:     Rate and Rhythm: Normal rate.  Pulmonary:     Effort: Pulmonary effort is normal.  Abdominal:     Comments: Gravid  Musculoskeletal:        General: No swelling.  Skin:    General: Skin is warm and dry.  Neurological:     General: No focal deficit present.  Psychiatric:        Mood and Affect: Mood normal.   Presentation: cephalic by check  Fetal monitoring: Baseline: 140 bpm, Variability: Good {> 6 bpm), Accelerations: Reactive, and Decelerations: Absent Uterine activity: Irregular  Dilation: 1.5 Effacement (%): 30 Station: -2 Presentation: Vertex Exam by:: Silva Drone RN  Prenatal labs: ABO, Rh: O/Positive/-- (11/13 1536) Antibody:  Negative (11/13 1536) Rubella: <0.90 (11/13 1536) RPR: Non Reactive (03/20 1029)  HBsAg: Negative (11/13 1536)  HIV: Non Reactive (03/20 1029)  GC/Chlamydia:  Neisseria Gonorrhea  Date Value Ref Range Status  03/17/2024 Negative  Final   Chlamydia  Date Value Ref Range Status  03/17/2024 Negative  Final   GBS: Negative/-- (05/12 1715)   Prenatal Transfer Tool  Maternal Diabetes: No Genetic Screening: Normal Maternal Ultrasounds/Referrals: Normal Fetal Ultrasounds or other Referrals:  None Maternal Substance Abuse:  No Significant Maternal Medications: ASA Significant Maternal Lab Results: Group B Strep negative  Results for orders placed or performed during the hospital encounter of 04/04/24 (from the past 24 hours)  CBC   Collection Time: 04/04/24  3:56 AM  Result Value Ref Range   WBC 8.3 4.0 - 10.5 K/uL   RBC 3.69 (L) 3.87 -  5.11 MIL/uL   Hemoglobin 11.6 (L) 12.0 - 15.0 g/dL   HCT 29.5 (L) 62.1 - 30.8 %   MCV 90.8 80.0 - 100.0 fL   MCH 31.4 26.0 - 34.0 pg   MCHC 34.6 30.0 - 36.0 g/dL   RDW 65.7 84.6 - 96.2 %   Platelets 280 150 - 400 K/uL   nRBC 0.0 0.0 - 0.2 %  Comprehensive metabolic panel   Collection Time: 04/04/24  3:56 AM  Result Value Ref Range   Sodium 133 (L) 135 - 145 mmol/L   Potassium 3.5 3.5 - 5.1 mmol/L   Chloride 106 98 - 111 mmol/L   CO2 18 (L) 22 - 32 mmol/L   Glucose, Bld 87 70 - 99 mg/dL   BUN 9 6 - 20 mg/dL   Creatinine, Ser 9.52 0.44 - 1.00 mg/dL   Calcium 8.5 (L) 8.9 - 10.3 mg/dL   Total Protein 6.5 6.5 - 8.1 g/dL   Albumin 3.0 (L) 3.5 - 5.0 g/dL   AST 15 15 - 41 U/L   ALT 10 0 - 44 U/L   Alkaline Phosphatase 109 38 - 126 U/L   Total Bilirubin 0.6 0.0 - 1.2 mg/dL   GFR, Estimated >84 >13 mL/min   Anion gap 9 5 - 15    Assessment: Noreta DANIE HANNIG is a 23 y.o. G2P1001 at [redacted]w[redacted]d here for IOL 2/2 cHTN.  #Labor: Start with dual cytotec . Discussed roles for FB, AROM, pit #Pain: IV pain meds PRN, epidural upon request #FHT: Category  I #GBS/ID: Negative #MOF: bottle feeding #MOC: no method #Circ: Yes  #cHTN: No medications during pregnancy. No s/sx of SIPE #RNI: MMR PP  Maud Sorenson, MD [facility] for [facility], [facility]  04/04/2024, 4:42 AM

## 2024-04-05 ENCOUNTER — Encounter (HOSPITAL_COMMUNITY): Payer: Self-pay | Admitting: Obstetrics and Gynecology

## 2024-04-05 DIAGNOSIS — O1002 Pre-existing essential hypertension complicating childbirth: Secondary | ICD-10-CM | POA: Diagnosis not present

## 2024-04-05 DIAGNOSIS — Z3A39 39 weeks gestation of pregnancy: Secondary | ICD-10-CM

## 2024-04-05 MED ORDER — DIBUCAINE (PERIANAL) 1 % EX OINT: 1.0000 | TOPICAL_OINTMENT | CUTANEOUS | Status: DC | PRN

## 2024-04-05 MED ORDER — ACETAMINOPHEN 500 MG PO TABS
1000.0000 mg | ORAL_TABLET | Freq: Three times a day (TID) | ORAL | Status: DC
  Administered 2024-04-05 – 2024-04-07 (×6): 1000 mg via ORAL
  Filled 2024-04-05 (×6): qty 2

## 2024-04-05 MED ORDER — IBUPROFEN 800 MG PO TABS
800.0000 mg | ORAL_TABLET | Freq: Three times a day (TID) | ORAL | Status: DC
  Administered 2024-04-05 – 2024-04-07 (×6): 800 mg via ORAL
  Filled 2024-04-05 (×6): qty 1

## 2024-04-05 MED ORDER — WITCH HAZEL-GLYCERIN EX PADS: 1.0000 | MEDICATED_PAD | CUTANEOUS | Status: DC | PRN

## 2024-04-05 MED ORDER — POTASSIUM CHLORIDE CRYS ER 20 MEQ PO TBCR
20.0000 meq | EXTENDED_RELEASE_TABLET | Freq: Every day | ORAL | Status: DC
  Administered 2024-04-06 – 2024-04-07 (×2): 20 meq via ORAL
  Filled 2024-04-05 (×2): qty 1

## 2024-04-05 MED ORDER — BENZOCAINE-MENTHOL 20-0.5 % EX AERO
1.0000 | INHALATION_SPRAY | CUTANEOUS | Status: DC | PRN
  Administered 2024-04-05: 1 via TOPICAL
  Filled 2024-04-05: qty 56

## 2024-04-05 MED ORDER — FUROSEMIDE 20 MG PO TABS
40.0000 mg | ORAL_TABLET | Freq: Every day | ORAL | Status: DC
  Administered 2024-04-06 – 2024-04-07 (×2): 40 mg via ORAL
  Filled 2024-04-05 (×2): qty 2

## 2024-04-05 MED ORDER — ZOLPIDEM TARTRATE 5 MG PO TABS: 5.0000 mg | ORAL_TABLET | Freq: Every evening | ORAL | Status: DC | PRN

## 2024-04-05 MED ORDER — ONDANSETRON HCL 4 MG PO TABS: 4.0000 mg | ORAL_TABLET | ORAL | Status: DC | PRN

## 2024-04-05 MED ORDER — COCONUT OIL OIL: 1.0000 | TOPICAL_OIL | Status: DC | PRN

## 2024-04-05 MED ORDER — MEDROXYPROGESTERONE ACETATE 150 MG/ML IM SUSP
150.0000 mg | INTRAMUSCULAR | Status: AC | PRN
Start: 2024-04-05 — End: 2024-04-07
  Administered 2024-04-07: 150 mg via INTRAMUSCULAR
  Filled 2024-04-05: qty 1

## 2024-04-05 MED ORDER — ONDANSETRON HCL 4 MG/2ML IJ SOLN: 4.0000 mg | INTRAMUSCULAR | Status: DC | PRN

## 2024-04-05 MED ORDER — DIPHENHYDRAMINE HCL 25 MG PO CAPS: 25.0000 mg | ORAL_CAPSULE | Freq: Four times a day (QID) | ORAL | Status: DC | PRN

## 2024-04-05 MED ORDER — OXYCODONE HCL 5 MG PO TABS: 10.0000 mg | ORAL_TABLET | Freq: Four times a day (QID) | ORAL | Status: DC | PRN

## 2024-04-05 MED ORDER — LACTATED RINGERS AMNIOINFUSION: INTRAVENOUS | Status: DC

## 2024-04-05 MED ORDER — OXYCODONE HCL 5 MG PO TABS
5.0000 mg | ORAL_TABLET | Freq: Four times a day (QID) | ORAL | Status: DC | PRN
  Administered 2024-04-05 – 2024-04-06 (×3): 5 mg via ORAL
  Filled 2024-04-05 (×3): qty 1

## 2024-04-05 MED ORDER — MEASLES, MUMPS & RUBELLA VAC IJ SOLR
0.5000 mL | Freq: Once | INTRAMUSCULAR | Status: AC
  Administered 2024-04-07: 0.5 mL via SUBCUTANEOUS
  Filled 2024-04-05: qty 0.5

## 2024-04-05 MED ORDER — PRENATAL MULTIVITAMIN CH
1.0000 | ORAL_TABLET | Freq: Every day | ORAL | Status: DC
  Administered 2024-04-06 – 2024-04-07 (×2): 1 via ORAL
  Filled 2024-04-05 (×2): qty 1

## 2024-04-05 MED ORDER — SENNOSIDES-DOCUSATE SODIUM 8.6-50 MG PO TABS
2.0000 | ORAL_TABLET | Freq: Every day | ORAL | Status: DC
  Administered 2024-04-06 – 2024-04-07 (×2): 2 via ORAL
  Filled 2024-04-05 (×2): qty 2

## 2024-04-05 MED ORDER — SIMETHICONE 80 MG PO CHEW: 80.0000 mg | CHEWABLE_TABLET | ORAL | Status: DC | PRN

## 2024-04-05 NOTE — Discharge Summary (Signed)
 Postpartum Discharge Summary  Date of Service updated***     Patient Name: Betty Logan DOB: 2001-09-02 MRN: [id number]  Date of admission: 04/04/2024 Delivery date:04/05/2024 Delivering provider: Maud Sorenson Date of discharge: 04/05/2024  Admitting diagnosis: Encounter for induction of labor [Z34.90] Intrauterine pregnancy: [redacted]w[redacted]d     Secondary diagnosis:  Principal Problem:   SVD (spontaneous vaginal delivery) Active Problems:   MDD (major depressive disorder), recurrent episode, moderate (HCC)   Post traumatic stress disorder (PTSD)   history Chronic Hypertension affection pregnancy   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 yo G2P1001 at [redacted]w[redacted]d was admitted to the hospital 04/04/2024 for induction of labor.  Indication for induction: cHTN (no medications).  Patient had an labor course complicated by none. Membrane Rupture Time/Date: 6:00 AM,04/04/2024  Delivery Method:Vaginal, Spontaneous Operative Delivery:N/A Episiotomy: None Lacerations:  None Details of delivery can be found in separate delivery note.  Patient had a postpartum course complicated by***. Patient is discharged home 04/05/24.  Newborn Data: Birth date:04/05/2024 Birth time:4:32 AM Gender:Female Living status:Living Apgars:8 ,8  Weight:   Magnesium Sulfate received: No BMZ received: No Rhophylac:No MMR:Yes T-DaP:Given prenatally Flu: N/A RSV Vaccine received: No Transfusion:{Transfusion received:30440034}  Immunizations received: Immunization History  Administered Date(s)  Administered   DTaP 08/21/2000, 10/23/2000, 01/22/2001, 12/03/2001   HIB (PRP-OMP) 08/21/2000, 10/23/2000, 01/22/2001, 07/30/2001   HPV 9-valent 06/25/2017, 07/26/2017, 04/22/2018   Hepatitis A, Ped/Adol-2 Dose 06/25/2017, 04/22/2018   Hepatitis B April 21, 2000, 08/21/2000, 01/22/2001   IPV 08/21/2000, 10/23/2000, 07/30/2001, 07/27/2005   Influenza,inj,Quad PF,6+ Mos 09/18/2017, 09/29/2019, 07/08/2021   Influenza-Unspecified 10/09/2007   MMR 07/30/2001, 07/27/2005   Meningococcal Conjugate 06/25/2017   PFIZER(Purple Top)SARS-COV-2 Vaccination 05/20/2020   Pneumococcal-Unspecified 08/21/2000, 10/23/2000, 01/22/2001   Tdap 08/01/2011, 10/27/2021, 02/07/2024   Varicella 07/30/2001, 06/25/2017    Physical exam  Vitals:   04/05/24 0230 04/05/24 0300 04/05/24 0309 04/05/24 0330  BP: 122/78 115/74  107/70  Pulse: 94 85  75  Resp:      Temp:      TempSrc:      SpO2: 100% 100% 100% 100%  Weight:      Height:       General: {Exam; general:21111117} Lochia: {Desc; appropriate/inappropriate:30686::"appropriate"} Uterine Fundus: {Desc; firm/soft:30687} Incision: {Exam; incision:21111123} DVT Evaluation: {Exam; dvt:2111122} Labs: Lab Results  Component Value Date   WBC 10.4 04/04/2024   HGB 12.0 04/04/2024   HCT 35.0 (L) 04/04/2024   MCV 90.7 04/04/2024   PLT 274 04/04/2024      Latest Ref Rng & Units 04/04/2024    3:56 AM  CMP  Glucose 70 - 99 mg/dL 87   BUN 6 - 20 mg/dL 9   Creatinine 6.94 - 8.54 mg/dL 6.27   Sodium 035 - 009 mmol/L 133   Potassium 3.5 - 5.1 mmol/L 3.5   Chloride 98 - 111 mmol/L 106   CO2 22 - 32 mmol/L 18   Calcium 8.9 - 10.3 mg/dL 8.5  Total Protein 6.5 - 8.1 g/dL 6.5   Total Bilirubin 0.0 - 1.2 mg/dL 0.6   Alkaline Phos 38 - 126 U/L 109   AST 15 - 41 U/L 15   ALT 0 - 44 U/L 10    Edinburgh Score:    11/08/2021    2:31 PM  Edinburgh Postnatal Depression Scale Screening Tool  I have been able to laugh and see the funny side of things. 0  I have  looked forward with enjoyment to things. 0  I have blamed myself unnecessarily when things went wrong. 2  I have been anxious or worried for no good reason. 0  I have felt scared or panicky for no good reason. 0  Things have been getting on top of me. 1  I have been so unhappy that I have had difficulty sleeping. 0  I have felt sad or miserable. 3  I have been so unhappy that I have been crying. 0  The thought of harming myself has occurred to me. 0  Edinburgh Postnatal Depression Scale Total 6   No data recorded  After visit meds:  Allergies as of 04/05/2024       Reactions   Cherry Swelling     Med Rec must be completed prior to using this [facility]***        Discharge home in stable condition Infant Feeding: {Baby feeding:23562} Infant Disposition:{CHL IP OB HOME WITH ZOXWRU:04540} Discharge instruction: per After Visit Summary and Postpartum booklet. Activity: Advance as tolerated. Pelvic rest for 6 weeks.  Diet: {OB JWJX:[id number]} Future Appointments: Future Appointments  Date Time Provider Department Center  04/18/2024  9:00 AM [facility] NURSE [facility]  05/06/2024  1:35 PM Delfina Feller [facility]   Follow up Visit:  Message sent to [facility] 5/31  Please schedule this patient for a In person postpartum visit in 6 weeks with the following provider: Any provider. Additional Postpartum F/U: BP check 1 week  Low risk pregnancy complicated by: cHTN Delivery mode:  Vaginal, Spontaneous Anticipated Birth Control:  Unsure   04/05/2024 Maud Sorenson, MD

## 2024-04-05 NOTE — Progress Notes (Addendum)
 LABOR PROGRESS NOTE  Patient Name: CELINES FEMIA, female   DOB: 11/06/2001, 23 y.o.  MRN: [id number]  Patient comfortable s/p epidural. Starting to have recurrent, deeper variables after restart of pitocin . Continued moderate variability. Discussed IUPC placement for amnioinfusion; she is amenable. Discussed R/B/A of IUPC placement, and verbal consent obtained. Placed without difficulty. Mom and babe tolerated well. 250 ml bolus followed by 125/hr for amnioinfusion. Cervix 8.5/80/0. Babe mildly asynclitic. Continue pit for adequate contractions. Anticipate NSVD soon.  Maud Sorenson, MD

## 2024-04-06 MED ORDER — WHITE PETROLATUM EX OINT: 1.0000 | TOPICAL_OINTMENT | CUTANEOUS | Status: DC | PRN

## 2024-04-06 MED ORDER — EPINEPHRINE TOPICAL FOR CIRCUMCISION 0.1 MG/ML: 1.0000 [drp] | TOPICAL | Status: DC | PRN

## 2024-04-06 MED ORDER — GELATIN ABSORBABLE 12-7 MM EX MISC: 1.0000 | Freq: Once | CUTANEOUS | Status: DC | PRN

## 2024-04-06 MED ORDER — LIDOCAINE 1% INJECTION FOR CIRCUMCISION
0.8000 mL | INJECTION | Freq: Once | INTRAVENOUS | Status: DC
  Filled 2024-04-06: qty 1

## 2024-04-06 MED ORDER — SUCROSE 24% NICU/PEDS ORAL SOLUTION: 0.5000 mL | OROMUCOSAL | Status: DC | PRN

## 2024-04-06 NOTE — Progress Notes (Addendum)
 error
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
## 2024-04-06 NOTE — Anesthesia Postprocedure Evaluation (Signed)
 Anesthesia Post Note  Patient: NASYA VINCENT  Procedure(s) Performed: AN AD HOC LABOR EPIDURAL     Patient location during evaluation: Mother Baby Anesthesia Type: Epidural Level of consciousness: awake, oriented and awake and alert Pain management: satisfactory to patient Vital Signs Assessment: post-procedure vital signs reviewed and stable Respiratory status: spontaneous breathing, nonlabored ventilation and respiratory function stable Cardiovascular status: blood pressure returned to baseline and stable Postop Assessment: no headache, no backache, no apparent nausea or vomiting, able to ambulate, adequate PO intake and patient able to bend at knees Anesthetic complications: no   No notable events documented.  Last Vitals:  Vitals:   04/06/24 0511 04/06/24 0647  BP: (!) 128/98 (!) 124/93  Pulse: 68 74  Resp: 20   Temp: 37 C   SpO2: 100%     Last Pain:  Vitals:   04/06/24 0519  TempSrc:   PainSc: 0-No pain   Pain Goal: Patients Stated Pain Goal: 0 (04/04/24 1915)                 Ashani Pumphrey

## 2024-04-07 ENCOUNTER — Other Ambulatory Visit (HOSPITAL_COMMUNITY): Payer: Self-pay

## 2024-04-07 MED ORDER — MEDROXYPROGESTERONE ACETATE 150 MG/ML IM SUSP: 150.0000 mg | Freq: Once | INTRAMUSCULAR | Status: DC

## 2024-04-07 MED ORDER — SENNOSIDES-DOCUSATE SODIUM 8.6-50 MG PO TABS
2.0000 | ORAL_TABLET | Freq: Every day | ORAL | 0 refills | Status: DC
  Filled 2024-04-07: qty 60, 30d supply, fill #0

## 2024-04-07 MED ORDER — IBUPROFEN 800 MG PO TABS
800.0000 mg | ORAL_TABLET | Freq: Three times a day (TID) | ORAL | 0 refills | Status: DC
  Filled 2024-04-07: qty 30, 10d supply, fill #0

## 2024-04-07 MED ORDER — FUROSEMIDE 40 MG PO TABS
40.0000 mg | ORAL_TABLET | Freq: Every day | ORAL | 0 refills | Status: DC
  Filled 2024-04-07: qty 6, 6d supply, fill #0

## 2024-04-07 MED ORDER — POTASSIUM CHLORIDE CRYS ER 20 MEQ PO TBCR
40.0000 meq | EXTENDED_RELEASE_TABLET | Freq: Every day | ORAL | 0 refills | Status: DC
  Filled 2024-04-07: qty 6, 3d supply, fill #0

## 2024-04-07 NOTE — Lactation Note (Signed)
 This note was copied from a baby's chart. Lactation Consultation Note  Patient Name: Betty Logan [id number] Date: 04/07/2024 Age:24 hours Reason for consult: Initial assessment;Term;Breastfeeding assistance;RN request  P2- RN called and asked for LC to see MOB because lactation team has not seen her yet at 44 hrs and she is breastfeeding. When [facility] checked the chart to see why the [facility] team had not seen her, it was noted that MOB was planning to offer formula only. When LC asked MOB about this, she reports that she was planning to formula feed only, but infant has been eager to latch. Per the RN and MOB, they have attempted to latch infant, but infant seems to not be able to latch and stay on. MOB was able to breastfeed her last child for a year. MOB had infant on the right breast in the cradle hold. Infant was eager to latch, but he was bobbing around due to lack of neck support. LC demonstrated how to hold infant in the cross cradle hold instead. Infant latched immediately and stayed latched. Infant had a strong rhythmical suck. Multiple swallows were noted. LC praised MOB and infant. MOB denies having further questions or concerns at this time.  LC reviewed cluster feeding, feeding infant on cue 8-12x in 24 hrs, not allowing infant to go over 3 hrs without a feeding, CDC milk storage guidelines, LC services handout and engorgement/breast care. LC encouraged MOB to call for further assistance as needed.  Maternal Data Does the patient have breastfeeding experience prior to this delivery?: Yes How long did the patient breastfeed?: 1 year  Feeding Mother's Current Feeding Choice: Breast Milk and Formula  LATCH Score Latch: Grasps breast easily, tongue down, lips flanged, rhythmical sucking.  Audible Swallowing: Spontaneous and intermittent  Type of Nipple: Everted at rest and after stimulation  Comfort (Breast/Nipple): Soft / non-tender  Hold (Positioning): Assistance needed to correctly  position infant at breast and maintain latch.  LATCH Score: 9   Lactation Tools Discussed/Used Pump Education: Milk Storage  Interventions Interventions: Breast feeding basics reviewed;Assisted with latch;Breast compression;Adjust position;Support pillows;Position options;Education;LC Services brochure  Discharge Discharge Education: Engorgement and breast care;Warning signs for feeding baby Pump: Advised to call insurance company  Consult Status Consult Status: Follow-up Date: 04/08/24 Follow-up type: In-patient    Vernette Goo BS, IBCLC 04/07/2024, 1:00 AM

## 2024-04-07 NOTE — Lactation Note (Signed)
 This note was copied from a baby's chart. Lactation Consultation Note  Patient Name: Betty Logan Date: 04/07/2024 Age:24 hours Reason for consult: Follow-up assessment  P2, Mother states breastfeeding is going fine and denies questions or concerns. Provided mother with manual pump. Encouraged offering the breast before formula. Reviewed engorgement care and monitoring voids/stools.  Maternal Data Has patient been taught Hand Expression?: Yes  Feeding Mother's Current Feeding Choice: Breast Milk and Formula Nipple Type: Slow - flow  Interventions Interventions: Education;Hand pump  Discharge Discharge Education: Engorgement and breast care;Warning signs for feeding baby  Consult Status Consult Status: Complete Date: 04/07/24  Luellen Sages  RN, IBCLC 04/07/2024, 10:52 AM

## 2024-04-08 LAB — BPAM RBC
Blood Product Expiration Date: 202506212359
Blood Product Expiration Date: 202506252359
ISSUE DATE / TIME: 202505280812
Unit Type and Rh: 5100
Unit Type and Rh: 5100

## 2024-04-08 LAB — TYPE AND SCREEN
ABO/RH(D): O POS
Antibody Screen: POSITIVE
Unit division: 0
Unit division: 0

## 2024-04-15 ENCOUNTER — Telehealth (HOSPITAL_COMMUNITY): Payer: Self-pay | Admitting: *Deleted

## 2024-04-15 DIAGNOSIS — Z1331 Encounter for screening for depression: Secondary | ICD-10-CM

## 2024-04-15 NOTE — Telephone Encounter (Signed)
 04/15/2024  Name: ROYALTY FAKHOURI MRN: [id number] DOB: 06/02/2001  Reason for Call:  Transition of Care Hospital Discharge Call  Contact Status: Patient Contact Status: Complete  Language assistant needed: Interpreter Mode: Interpreter Not Needed        Follow-Up Questions: Do You Have Any Concerns About Your Health As You Heal From Delivery?: No Do You Have Any Concerns About Your Infants Health?: No  Edinburgh Postnatal Depression Scale:  In the Past 7 Days: I have been able to laugh and see the funny side of things.: Not quite so much now I have looked forward with enjoyment to things.: Rather less than I used to I have blamed myself unnecessarily when things went wrong.: Not very often I have been anxious or worried for no good reason.: No, not at all I have felt scared or panicky for no good reason.: No, not at all Things have been getting on top of me.: Yes, sometimes I haven't been coping as well as usual I have been so unhappy that I have had difficulty sleeping.: Not at all I have felt sad or miserable.: Not very often I have been so unhappy that I have been crying.: No, never The thought of harming myself has occurred to me.: Never Edinburgh Postnatal Depression Scale Total: 6  PHQ2-9 Depression Scale:     Discharge Follow-up: Edinburgh score requires follow up?: Yes (EPDS score of 6, however, patient verbalizes feeling like she is struggling emotionally.) Provider notified of Edinburgh score?: Yes Have you already been referred for a counseling appointment?: No Patient was advised of the following resources:: Support Group, Breastfeeding Support Group, Lactation, Other (comment) (Maternal Mental Health Resources) Patient referred to:: OP Lactation  Post-discharge interventions: Reviewed Newborn Safe Sleep Practices Maternal Mental Health Resources provided Placed [facility] referral in patient's chart  Pearlie Bougie, RN 04/15/2024 12:02

## 2024-04-18 ENCOUNTER — Ambulatory Visit

## 2024-04-24 ENCOUNTER — Ambulatory Visit

## 2024-04-24 ENCOUNTER — Other Ambulatory Visit: Payer: Self-pay

## 2024-04-24 NOTE — Progress Notes (Unsigned)
 Blood Pressure Check Visit  Betty Logan is here for blood pressure check following spontaneous vaginal birth on 04/05/24. BP today is ***. Patient denies/endorses any {Symptoms; hypertension cc:11129:s:dizzness,blurred vision,headache,shortness of breath,peripheral edema}.  Ninette Basque, RN 04/24/2024  8:19 AM

## 2024-05-06 ENCOUNTER — Other Ambulatory Visit: Payer: Self-pay

## 2024-05-06 ENCOUNTER — Ambulatory Visit (INDEPENDENT_AMBULATORY_CARE_PROVIDER_SITE_OTHER): Payer: Self-pay | Admitting: Certified Nurse Midwife

## 2024-05-06 DIAGNOSIS — I10 Essential (primary) hypertension: Secondary | ICD-10-CM

## 2024-05-06 DIAGNOSIS — Z1331 Encounter for screening for depression: Secondary | ICD-10-CM

## 2024-05-06 DIAGNOSIS — I152 Hypertension secondary to endocrine disorders: Secondary | ICD-10-CM

## 2024-05-06 NOTE — Patient Instructions (Signed)
[place] Resource Guide (Revised August 2014)    Insufficient Money for Medicine:           United Way: call "211"   MAP Program at [facility] [place] [phone] or [place] [phone]            No Primary Care Doctor:  To locate a primary care doctor that accepts your insurance or provides certain services:           [place] [facility]: [phone]           Physician Referral Service: 1-800-533-3463 ask for "My [place]" If no insurance, you need to see if you qualify for GCCN "orange card", call to set      up appointment for eligibility/enrollment at [phone] or [phone] or visit [place] Dept[facility] of [facility] and [facility] Services ([address], [place] and [address] -[place]) to meet with a GCCN enrollment specialist.  Agencies that provide inexpensive (sliding fee scale) medical care:      [facility] - [facility] at [place] - [phone]    [facility]  -  [place] [facility] - [phone]    [facility] - [phone]    [facility] - [phone]    [facility] - [phone]    [facility] - [phone] [facility] - [facility] [place] - [phone]     [phone] [facility] - [facility] [place] - [phone] [facility]: [phone]  [facility] Clinic: [phone]  Planned Parenthood: [phone]  [facility] - [phone]- [phone]    Medicaid-accepting [place] Providers:           [facility] - [phone] (No Family Planning accepted)          [address], [phone], Mon-Fri 9am-5pm          [facility] - [phone], [address], Mon-Thursday 8am-5pm, Fri 8am-noon [facility] - [phone]          [address],  [address], Mon-Fri 7:30am-4:30pm          [facility] - [phone]          [address] [place] [address], Mon-Fri 8am-5pm          [facility] - [phone]          [address]          Only accepts [place] Access Medicaid patients after they have their name applied to their card  Self Pay (no insurance) in [place]:           Sickle Cell Patients:  [address], [phone] [facility]: [address], [place] [phone]       [facility] [address], [place] [phone]  [facility]: [address], [phone]          [facility]           [address], [phone] [facility] [address], [phone]           [facility] [place]             [address], [place] [phone]        [facility] - [address]           [phone], Mon-Fri 9am-7pm, Sat 9am-1pm          [facility] - [facility] [place]          [address] [place] [address], [phone]          [facility] [place]           [address], [phone] [facility] - [facility] [place] [address], [place] [phone]          [facility]           [address] [place] [address], [phone]  [facility] - [facility]  [address], [phone] [facility] - [facility] [address], [phone]  Dr. Osei-Bonsu           [address], [place], [phone]          [facility]           [address], [phone]          [facility]             [address], [phone]          [facility]           [address], [phone], 1st & 3rd Saturday every month, 10am-1pm OTHERS:  [facility]  (Immigration Access Clinic Only)  [phone] (Thursday only) Strategies for finding a Primary Care Provider:  1) Find a Doctor and Pay Out of Pocket  Although you won't have to find out who is covered by your insurance plan, it is a good idea to ask around and get recommendations. You will then need to call the office and see if the doctor you have chosen will accept you as a new patient and what types of options they offer for patients who are self-pay. Some doctors offer discounts or will set up payment plans for their patients who do not have insurance, but you will need to ask so you aren't surprised when you get to your appointment.  2) Contact [facility] - To see if you qualify for "orange card" access to healthcare safety net providers.  Call for appointment for eligibility/enrollment at [phone] or [phone]. (Uninsured, 0-200% FPL, qualifying info)  Applicants for GCCN are first required to see if they are eligible to enroll in the ACA Marketplace before enrolling in GCCN (and get an exemption if they are not).  GCCN Criteria for acceptance is:  Proof of ACA Marketing exemption - form or documentation  Valid photo ID (driver's license, state identification card, passport, home country ID)  Proof of [place] residency (e.g. driver's license, lease/landlord information, pay stubs with address, utility bill, bank statement, etc.)  Proof of income (1040, last year's tax return, W2, 4 current pay stubs, other income proof)  Proof of assets (current bank statement + 3 most recent, disability paperwork, life insurance info, tax value on autos, etc.)  3) Contact Your Local Health Department  Not all health departments have doctors that can see patients for sick visits,   but many do, so it is worth a call to see if yours does. If you don't know where your local health  department is, you can check in your phone book. The CDC also has a tool to help you locate your state's health department, and many state websites also have listings of all of their local health departments.  4) Find a Walk-in Clinic  If your illness is not likely to be very severe or complicated, you may want to try a walk in clinic. These are popping up all over the country in pharmacies, drugstores, and shopping centers. They're usually staffed by nurse practitioners or physician assistants that have been trained to treat common illnesses and complaints. They're usually fairly quick and inexpensive. However, if you have serious medical issues or chronic medical problems, these are probably not your best option   STD Testing:           [place] [facility] [place], STD Clinic           [address], [place], phone [phone] or [phone]           Monday - Friday, call for an appointment          [place] [facility] [place], STD Clinic           [address], [place], phone [phone] or [phone]           Monday - Friday, call for an appointment Abuse/Neglect:           [place] Child Abuse Hotline: 641-3795           [place] Child Abuse Hotline: 800-378-5315 (After Hours) Emergency Shelter:  [facility] (336) 271-5959  [facility] [place]- (336) 881-5415  [facility] [place] - (336) 235-0330  [facility] - (336) 375-1332 (ages 11-17)  Homeless Day Shelter @ [facility] - (336) 332-0824   Mammograms - Free at [facility] [place] - [phone]  Maternity Homes:           [facility]: (336) [phone]   (Homeless mother with children)          [facility]: (704) 372-4663 (Mothers only) [facility]: (336) 370-9232 (Pregnant 16-21 years old) Adopt a Mom -(336) 641-7777  [place] Resources  [facility] - [facility] [address], [place] ([phone]) [phone]          [facility] [place]           [address], [place]           [phone]          United Way           [address], [place]           [phone]          [place] [facility]           [address], [place]           [phone]          [place] [facility]           [phone]          [place] Services - [facility]           1-888-581-9988            [facility] in [place]           [address]           [phone], Insurance          [place] Child Abuse Hotline           (336) 342-1394           (336) 342-3537 (After Hours)  Behavioral Health Services /Substance Abuse Resources:           [facility] Drug Services: 882-2125           [facility]: [phone]          [facility]: (336) 285-9073  Narcotics Helpline - 1-866-375-1272          [facility]: (336) 845-3988           Residential & Outpatient Substance Abuse Program - [facility]: 800-659-3381 [employer]  [facility] - (336) 285-2605 Psychological Services:           Health: [phone]  [facility]: 1-877-626-1772          [facility]           [address] [place] [address], [place]           ACCESS LINE: 1-800-256-2452     (24 Hour) Mobile Crisis:  [place]ES:  [facility] on Mental Illness - [place] (336) 370-4264 [facility] - [place] (800) 451-9682 Walk In Crisis Services       [facility] - [address] [place] [address] - [place]  (336)[phone]        Health - (336)832-9600 or (336) 832-9700  [facility] - [address] - [place] (336)[phone]  [facility] [place] [facility] - [address], [place] (336) [phone]   Dental Assistance:   If unable to pay or uninsured, contact: [place] [facility] to become qualified for the adult dental clinic. Patient must be enrolled in GCCN (uninsured, 0-200% FPL, qualifying info).  Enroll in GCCN first, then see Primary Care Physician assigned to you, the PCP makes a dental referral. [facility] will receive referral and contacts patient for appointment.  Patients with Medicaid           [address], [phone]  [facility] (Children up to 20 + Pregnant Women) - (336) [phone]  [facility] - [address] - [address] (336) [phone]  If unable to pay, or uninsured: contact [place] [facility] ([phone] in [place] - (Children only + Pregnant Women), [phone] in [place]- Children only) to become qualified for the adult dental clinic  Must see if eligible to enroll in ACA Health Insurance Marketplace before enrolling into the GCCN (exemption required) (1-855-733-3711 for an appointment)  www.healthcare.gov;   1-800-318-2596.  If not eligible for ACA, then go by Department of Health and Human Services to see if eligible for "orange card."  [address], [place] and [address]- [place].  Once you get an orange card, you will have a Primary Care home who will then refer you to dental if needed.     Other Low-Cost Community Dental Services:   [facility] - [phone] (ext [phone])   [address] [place] [address]  Dr. Civils - [phone]   [address]    [employer] - [phone]   [address]           [facility]           [address], [place], [place], [address]           [phone], Ext. [phone]           2nd and 4th Thursday of the month at 6:30am (Simple extractions only - no wisdom teeth or surgery) First come/First serve -First 10 clients served           [facility] ([place] and [place] residents only)          [address], [place], [place], [address]            [phone]                    [place] [facility]           [phone]          [place] [facility]          [phone]         [facility] - [facility]          [phone]   Transportation Options:  Ambulance - 911 - $250-$700 per ride Family Member to accompany patient (if stable) - [place] [employer] - (336) 355-6499  PART - (336) [phone]  Taxi - (336) [phone] - [place]  SCAT - (336) 333-6589 (Application required)  [place] [facility] - (336) [phone]  

## 2024-05-06 NOTE — Progress Notes (Unsigned)
 Post Partum Visit Note  Betty Logan is a 23 y.o. G70P2002 female who presents for a postpartum visit. She is 4 weeks 3 days postpartum following a normal spontaneous vaginal delivery on 04/05/24.  I have fully reviewed the prenatal and intrapartum course. The delivery was at 39.1 gestational weeks.  Anesthesia: epidural. Postpartum course has been good. Baby is doing well. Baby is feeding by both breast and bottle - Similac 360 Total Care. Bleeding staining only. Bowel function is normal. Bladder function is normal. Patient is not sexually active. Contraception method is Depo-Provera  injections. Postpartum depression screening: negative.   The pregnancy intention screening data noted above was reviewed. Potential methods of contraception were discussed. The patient elected to proceed with No data recorded.   Edinburgh Postnatal Depression Scale - 05/06/24 1343       Edinburgh Postnatal Depression Scale:  In the Past 7 Days   I have been able to laugh and see the funny side of things. 0    I have looked forward with enjoyment to things. 0    I have blamed myself unnecessarily when things went wrong. 1    I have been anxious or worried for no good reason. 1    I have felt scared or panicky for no good reason. 0    Things have been getting on top of me. 3    I have been so unhappy that I have had difficulty sleeping. 0    I have felt sad or miserable. 0    I have been so unhappy that I have been crying. 0    The thought of harming myself has occurred to me. 0    Edinburgh Postnatal Depression Scale Total 5          Health Maintenance Due  Topic Date Due   Medicare Annual Wellness (AWV)  Never done   Meningococcal B Vaccine (1 of 2 - Standard) Never done   COVID-19 Vaccine (2 - 2024-25 season) 07/08/2023    {Common ambulatory SmartLinks:19316}  Review of Systems {ros; complete:30496}  Objective:  BP (!) 133/91   Pulse 70   Wt 126 lb (57.2 kg)   LMP 07/06/2023 (Exact  Date)   Breastfeeding Yes   BMI 20.34 kg/m    General:  {gen appearance:16600}   Breasts:  {desc; normal/abnormal/not indicated:14647}  Lungs: {lung exam:16931}  Heart:  {heart exam:5510}  Abdomen: {abdomen exam:16834}   Wound {Wound assessment:11097}  GU exam:  {desc; normal/abnormal/not indicated:14647}       Assessment:    1. Positive depression screening ***  2. Postpartum exam ***   *** postpartum exam.   Plan:   Essential components of care per ACOG recommendations:  1.  Mood and well being: Patient with {gen negative/positive:315881} depression screening today. Reviewed local resources for support.  - Patient tobacco use? {tobacco use:25506}  - hx of drug use? {yes/no:25505}    2. Infant care and feeding:  -Patient currently breastmilk feeding? {yes/no:25502}  -Social determinants of health (SDOH) reviewed in EPIC. No concerns***The following needs were identified***  3. Sexuality, contraception and birth spacing - Patient  want a pregnancy in the next year.  Desired family size is {DOES_DOES NOT:18564}{NUMBER 1-10:22536} children.  - Reviewed reproductive life planning. Reviewed contraceptive methods based on pt preferences and effectiveness.  Patient desired {Upstream End Methods:24109} today.   - Discussed birth spacing of 18 months  4. Sleep and fatigue -Encouraged family/partner/community support of 4 hrs of uninterrupted sleep to help with  mood and fatigue  5. Physical Recovery  - Discussed patients delivery and complications. She describes her labor as {description:25511} - Patient had a {CHL AMB DELIVERY:[phone]}. Patient had a {laceration:25518} laceration. Perineal healing reviewed. Patient expressed understanding - Patient has urinary incontinence? {yes/no:25515} - Patient {ACTION; IS/IS WNU:78978602} safe to resume physical and sexual activity  6.  Health Maintenance - HM due items addressed {Yes or If no, why not?:20788} - Last pap smear   Diagnosis  Date Value Ref Range Status  09/19/2023   Final   - Negative for intraepithelial lesion or malignancy (NILM)   Pap smear {done:10129} at today's visit.  -Breast Cancer screening indicated? {indicated:25516}  7. Chronic Disease/Pregnancy Condition follow up: Hypertension  - PCP follow up  Camie DELENA Rote, CNM [facility] [employer], [facility]

## 2024-05-07 MED ORDER — NIFEDIPINE ER OSMOTIC RELEASE 30 MG PO TB24: 30.0000 mg | ORAL_TABLET | Freq: Every day | ORAL | 11 refills | Status: AC

## 2024-05-12 ENCOUNTER — Encounter

## 2024-05-28 ENCOUNTER — Telehealth: Payer: Self-pay | Admitting: Clinical

## 2024-05-28 NOTE — Telephone Encounter (Signed)
 Attempt to reschedule; Left HIPPA-compliant message to call back Warren from [employer] for [employer] at [facility] at  [phone] [facility] office).

## 2024-06-13 ENCOUNTER — Telehealth: Payer: Self-pay | Admitting: Clinical

## 2024-06-13 NOTE — Telephone Encounter (Signed)
Attempt call regarding referral; Left HIPPA-compliant message to call back Mikle Sternberg from [facility] at [facility] at  [phone] (Ashelyn Mccravy's office).    

## 2024-06-20 ENCOUNTER — Encounter: Payer: Self-pay | Admitting: Certified Nurse Midwife

## 2024-06-21 ENCOUNTER — Ambulatory Visit (HOSPITAL_COMMUNITY)
Admission: EM | Admit: 2024-06-21 | Discharge: 2024-06-21 | Disposition: A | Discharge status: Home / Self Care | Attending: Emergency Medicine | Admitting: Emergency Medicine

## 2024-06-21 ENCOUNTER — Encounter (HOSPITAL_COMMUNITY): Payer: Self-pay

## 2024-06-21 DIAGNOSIS — R519 Headache, unspecified: Secondary | ICD-10-CM

## 2024-06-21 MED ORDER — SUMATRIPTAN SUCCINATE 6 MG/0.5ML ~~LOC~~ SOLN
SUBCUTANEOUS | Status: AC
  Filled 2024-06-21: qty 0.5

## 2024-06-21 MED ORDER — DEXAMETHASONE SODIUM PHOSPHATE 10 MG/ML IJ SOLN
10.0000 mg | Freq: Once | INTRAMUSCULAR | Status: AC
  Administered 2024-06-21: 10 mg via INTRAMUSCULAR

## 2024-06-21 MED ORDER — KETOROLAC TROMETHAMINE 30 MG/ML IJ SOLN
INTRAMUSCULAR | Status: AC
  Filled 2024-06-21: qty 1

## 2024-06-21 MED ORDER — SUMATRIPTAN SUCCINATE 6 MG/0.5ML ~~LOC~~ SOLN
6.0000 mg | Freq: Once | SUBCUTANEOUS | Status: AC
  Administered 2024-06-21: 6 mg via SUBCUTANEOUS

## 2024-06-21 MED ORDER — DEXAMETHASONE SODIUM PHOSPHATE 10 MG/ML IJ SOLN
INTRAMUSCULAR | Status: AC
  Filled 2024-06-21: qty 1

## 2024-06-21 MED ORDER — KETOROLAC TROMETHAMINE 30 MG/ML IJ SOLN
30.0000 mg | Freq: Once | INTRAMUSCULAR | Status: AC
  Administered 2024-06-21: 30 mg via INTRAMUSCULAR

## 2024-06-21 NOTE — ED Provider Notes (Signed)
 [facility]    CSN: [id number] Arrival date & time: 06/21/24  1247      History   Chief Complaint Chief Complaint  Patient presents with   Headache    HPI Betty Logan is a 23 y.o. female.   Patient presents with constant headache over the last 3 days.  Patient rates her headache as 6 out of 10 at this time.  Patient denies any nausea, vomiting, vision changes, photophobia, dizziness, confusion, slurred speech, and facial droop.  Patient states that she has been taking Tylenol  with minimal relief.  Patient reports that she is currently breast-feeding.  Patient states that she had her baby back at the end of May.  Patient states that she was started on blood pressure medication shortly after she had her baby and she reports that she is taking this as prescribed daily.  Denies any chest pain, shortness of breath, and palpitations.  LMP 8/7.  The history is provided by the patient and medical records.  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 Was also negative one year ago, but positive 11/2018.     04/06/2021: Called and left VM for Tammy Faucet, TB control [facility] to discuss patient diagnosis and treatment. Per Dr. Lola, with negative Quantiferon-Gold, pt does not need any additional work-up for TB at this time.        Nausea and vomiting in pregnancy 03/30/2021   RX Diclegis  03/30/2021     Nightmares 10/08/2014   Supervision of high risk pregnancy, antepartum 03/23/2021              Nursing Staff    Provider      Office Location     [facility]    Dating     LMP      Language     English    Anatomy US      Normal but incomplete, f/u end of August 2022      Flu Vaccine     Received 07/08/21    Genetic/Carrier Screen     NIPS: Low risk female  AFP:  Not done   Horizon: neg      TDaP Vaccine      Declined 08/04/21    Hgb A1C or   GTT    Early   Third trimester       COVI   Supervision of other normal pregnancy, antepartum 09/03/2023              NURSING     PROVIDER      Office Location    [facility]    Dating by    LMP c/w U/S at 8 wks      [facility] Model    Traditional    Anatomy U/S           Initiated care at     [facility]     English                     LAB RESULTS       Support Person  Genetics    NIPS:   AFP:                 NT/IT (FT only)                     Carrier Screen    Horizon:           Patient Active Problem List   Diagnosis Date Noted   SVD (spontaneous vaginal delivery) 04/04/2024   Rubella non-immune status, antepartum 04/04/2024   Supervision of other normal pregnancy, antepartum 09/03/2023   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 Authorizing Provider  acetaminophen  (TYLENOL ) 325 MG tablet Take 2 tablets (650 mg total) by mouth every 6 (six) hours as needed. 10/27/21  Yes Dameron, Marisa, DO  NIFEdipine  (PROCARDIA  XL) 30 MG 24 hr tablet Take 1 tablet (30 mg total) by mouth daily. 05/07/24  Yes Warren-Hill, Camie LABOR, CNM  furosemide  (LASIX ) 40 MG tablet Take 1 tablet (40 mg total) by mouth daily. Patient not taking: Reported on 05/06/2024 04/08/24   Kumar, Agnijita, MD  ibuprofen  (ADVIL )  800 MG tablet Take 1 tablet (800 mg total) by mouth every 8 (eight) hours. Patient not taking: Reported on 05/06/2024 04/07/24   Kumar, Agnijita, MD  potassium chloride  SA (KLOR-CON  M) 20 MEQ tablet Take 2 tablets (40 mEq total) by mouth daily. Patient not taking: Reported on 05/06/2024 04/08/24   Kumar, Agnijita, MD  Prenatal Vit-Fe Fumarate-FA (PREPLUS) 27-1 MG TABS Take 1 tablet by mouth daily. Patient not taking: Reported on 05/06/2024 08/04/23   Vannie Cornell SAUNDERS, CNM  senna-docusate (SENOKOT-S) 8.6-50 MG tablet Take 2 tablets by mouth daily. Patient not taking: Reported on 05/06/2024 04/08/24   Von Reasoner, MD  albuterol  (VENTOLIN  HFA) 108 (90 Base) MCG/ACT inhaler Inhale 2 puffs into the lungs every 4 (four) hours as needed for wheezing or shortness of breath. Patient not taking: No sig reported 05/21/20 10/14/20  Ettefagh, Mallie Hamilton, MD  budesonide -formoterol  (SYMBICORT ) 80-4.5 MCG/ACT inhaler Inhale 2 puffs into the lungs in the morning and at bedtime. Patient not taking: Reported on 10/14/2020 02/18/20 10/14/20  Gretel Andes, MD  cetirizine  (ZYRTEC ) 10 MG tablet Take 1 tablet (10 mg total) by mouth daily. Patient not taking: Reported on 10/14/2020 02/18/20 10/14/20  Gretel Andes, MD  traZODone  (DESYREL ) 50 MG tablet Take 1 tablet (50 mg total) by mouth at bedtime as needed for sleep. Patient not taking: Reported on 05/27/2020 02/18/20 10/14/20  Gretel Andes, MD    Family History Family History  Problem Relation Age of Onset   Hypertension Mother    Seizures Father     Social History Social History  Tobacco Use   Smoking status: Former    Types: Cigarettes   Smokeless tobacco: Never   Tobacco comments:    As a kid, stopped when a teenage  Vaping Use   Vaping status: Never Used  Substance Use Topics   Alcohol use: Not Currently    Comment: rarely   Drug use: Not Currently    Types: Marijuana    Comment: several months ago     Allergies   Cherry   Review of  Systems Review of Systems  Neurological:  Positive for headaches.   Per HPI  Physical Exam Triage Vital Signs ED Triage Vitals  Encounter Vitals Group     BP 06/21/24 1335 (!) 135/93     Girls Systolic BP Percentile --      Girls Diastolic BP Percentile --      Boys Systolic BP Percentile --      Boys Diastolic BP Percentile --      Pulse Rate 06/21/24 1335 85     Resp 06/21/24 1335 18     Temp 06/21/24 1335 98.1 F (36.7 C)     Temp Source 06/21/24 1335 Oral     SpO2 06/21/24 1335 99 %     Weight --      Height 06/21/24 1335 5' 6 (1.676 m)     Head Circumference --      Peak Flow --      Pain Score 06/21/24 1334 6     Pain Loc --      Pain Education --      Exclude from Growth Chart --    No data found.  Updated Vital Signs BP (!) 135/93 (BP Location: Left Arm)   Pulse 85   Temp 98.1 F (36.7 C) (Oral)   Resp 18   Ht 5' 6 (1.676 m)   LMP 06/12/2024 (Exact Date)   SpO2 99%   Breastfeeding Yes   BMI 20.34 kg/m   Visual Acuity Right Eye Distance:   Left Eye Distance:   Bilateral Distance:    Right Eye Near:   Left Eye Near:    Bilateral Near:     Physical Exam Vitals and nursing note reviewed.  Constitutional:      General: She is awake. She is not in acute distress.    Appearance: Normal appearance. She is well-developed and well-groomed. She is not ill-appearing.  Eyes:     Extraocular Movements: Extraocular movements intact.     Conjunctiva/sclera: Conjunctivae normal.     Pupils: Pupils are equal, round, and reactive to light.  Musculoskeletal:        General: Normal range of motion.     Cervical back: Normal range of motion and neck supple.  Neurological:     General: No focal deficit present.     Mental Status: She is alert and oriented to person, place, and time. Mental status is at baseline.     GCS: GCS eye subscore is 4. GCS verbal subscore is 5. GCS motor subscore is 6.     Cranial Nerves: Cranial nerves 2-12 are intact.     Sensory:  Sensation is intact.     Motor: Motor function is intact.     Coordination: Coordination is intact.     Gait: Gait is intact.  Psychiatric:        Behavior: Behavior is cooperative.      UC Treatments / Results  Labs (all labs ordered are listed, but only abnormal results are displayed)  Labs Reviewed - No data to display  EKG   Radiology No results found.  Procedures Procedures (including critical care time)  Medications Ordered in UC Medications  ketorolac  (TORADOL ) 30 MG/ML injection 30 mg (has no administration in time range)  dexamethasone  (DECADRON ) injection 10 mg (has no administration in time range)  SUMAtriptan  (IMITREX ) injection 6 mg (has no administration in time range)    Initial Impression / Assessment and Plan / UC Course  I have reviewed the triage vital signs and the nursing notes.  Pertinent labs & imaging results that were available during my care of the patient were reviewed by me and considered in my medical decision making (see chart for details).     Patient is overall well-appearing.  Vitals are stable.  Blood pressure is mildly elevated at 135/93.  No other deficits noted in clinic.  GCS 15.  Given Toradol , Decadron , and Imitrex  given in clinic for bad headache.  Discussed follow-up, return, and strict ER precautions. Final Clinical Impressions(s) / UC Diagnoses   Final diagnoses:  Bad headache     Discharge Instructions      You were given a series of medications today to help with your headache: Toradol  which is an anti-inflammatory, Decadron  which is a steroid to help with inflammation, and sumatriptan  to help with your headache.  If your headache persists, worsens, you develop worsening vision changes, severe dizziness, passing out, weakness, numbness, chest pain, confusion, and slurred speech please seek immediate medical treatment in the emergency department.  Otherwise make sure you are staying hydrated and getting as much rest as  you can. Follow-up with your primary care provider for further evaluation and management of your blood pressure.     ED Prescriptions   None    PDMP not reviewed this encounter.   Johnie Flaming A, NP 06/21/24 1430

## 2024-06-21 NOTE — ED Triage Notes (Signed)
 Patient presenting with headache onset 3 days ago. Denies any nausea, emesis, or vision changes. States with her recent pregnancy she would get headaches. States has bee feeling unwell since she gave birth, general body discomfort.  Prescriptions or OTC medications tried: Yes- tylenol     with no relief

## 2024-06-21 NOTE — Discharge Instructions (Signed)
 You were given a series of medications today to help with your headache: Toradol  which is an anti-inflammatory, Decadron  which is a steroid to help with inflammation, and sumatriptan  to help with your headache.  If your headache persists, worsens, you develop worsening vision changes, severe dizziness, passing out, weakness, numbness, chest pain, confusion, and slurred speech please seek immediate medical treatment in the emergency department.  Otherwise make sure you are staying hydrated and getting as much rest as you can. Follow-up with your primary care provider for further evaluation and management of your blood pressure.

## 2024-06-27 NOTE — BH Specialist Note (Deleted)
 Integrated Behavioral Health via Telemedicine Visit  06/27/2024 Betty Logan [id number]  Number of Integrated Behavioral Health Clinician visits: No data recorded Session Start time: No data recorded  Session End time: No data recorded Total time in minutes: No data recorded  Referring Provider: Norleen Rover, MD Patient/Family location: Home*** Betty Logan Provider location: [facility]  All persons participating in visit: [name] and Betty Logan Betty Logan ***  Types of Service: {CHL AMB TYPE OF SERVICE:[phone]}  I connected with Betty Logan and/or Betty Logan's {family members:20773} via  Telephone or Engineer, civil (consulting)  (Video is Surveyor, mining) and verified that I am speaking with the correct person using two identifiers. Discussed confidentiality: Yes   I discussed the limitations of telemedicine and the availability of in person appointments.  Discussed there is a possibility of technology failure and discussed alternative modes of communication if that failure occurs.  I discussed that engaging in this telemedicine visit, they consent to the provision of behavioral healthcare and the services will be billed under their insurance.  Patient and/or legal guardian expressed understanding and consented to Telemedicine visit: Yes   Presenting Concerns: Patient and/or family reports the following symptoms/concerns: *** Duration of problem: ***; Severity of problem: {Mild/Moderate/Severe:20260}  Patient and/or Family's Strengths/Protective Factors: {CHL AMB BH PROTECTIVE [phone]}  Goals Addressed: Patient will:  Reduce symptoms of: {IBH Symptoms:21014056}   Increase knowledge and/or ability of: {IBH Patient Tools:21014057}   Demonstrate ability to: {IBH Goals:21014053}  Progress towards Goals: {CHL AMB BH PROGRESS TOWARDS  GOALS:[phone]}    Interventions: Interventions utilized:  {IBH Interventions:21014054} Standardized Assessments completed: {IBH Screening Tools:21014051}    Patient and/or Family Response: Patient agrees with treatment plan.   Clinical Assessment/Diagnosis  No diagnosis found. .   Patient may benefit from psychoeducation and brief therapeutic interventions regarding coping with symptoms of *** .  Plan: Follow up with behavioral health clinician on : *** Behavioral recommendations:  -*** -*** Referral(s): {IBH Referrals:21014055}  I discussed the assessment and treatment plan with the patient and/or parent/guardian. They were provided an opportunity to ask questions and all were answered. They agreed with the plan and demonstrated an understanding of the instructions.   They were advised to call back or seek an in-person evaluation if the symptoms worsen or if the condition fails to improve as anticipated.  Warren JAYSON Mering, LCSW     09/19/2023    2:39 PM 09/03/2023   10:34 AM 11/15/2021   10:27 AM 10/06/2021    8:17 AM 08/04/2021   10:12 AM  Depression screen PHQ 2/9  Decreased Interest 0 0 0 2 0  Down, Depressed, Hopeless 0 0 0 2 1  PHQ - 2 Score 0 0 0 4 1  Altered sleeping 0 0 1 3 3   Tired, decreased energy 0 0 0 3 0  Change in appetite 0 0 1 2 0  Feeling bad or failure about yourself  0 0 0 0 1  Trouble concentrating 0 0 0 0 3  Moving slowly or fidgety/restless 0 0 0 0 0  Suicidal thoughts 0 0 0 0 0  PHQ-9 Score 0 0 2 12 8   Difficult doing work/chores    Very difficult       09/19/2023    2:39 PM 09/03/2023   10:35 AM 11/15/2021   10:26 AM 08/04/2021   10:14 AM  GAD 7 : Generalized Anxiety Score  Nervous, Anxious, on Edge 0 0  0 0  Control/stop worrying 0 0 0 0  Worry too much - different things 0 0 1 0  Trouble relaxing 0 0 0 0  Restless 0 0 0 0  Easily annoyed or irritable 0 0 3 3  Afraid - awful might happen 0 0 0 0  Total GAD 7 Score 0 0 4 3

## 2024-07-02 ENCOUNTER — Encounter

## 2024-07-15 NOTE — BH Specialist Note (Unsigned)
 Integrated Behavioral Health via Telemedicine Visit  07/15/2024 Betty Logan [id number]  Number of Integrated Behavioral Health Clinician visits: No data recorded Session Start time: No data recorded  Session End time: No data recorded Total time in minutes: No data recorded  Referring Provider: Norleen Rover, MD Patient/Family location: Home*** [facility] location: [facility]  All persons participating in visit: [name] and [facility] Betty Logan ***  Types of Service: {CHL AMB TYPE OF [phone]}  I connected with Betty Logan and/or Betty Logan's {family members:20773} via  Telephone or Engineer, civil (consulting)  (Video is Surveyor, mining) and verified that I am speaking with the correct person using two identifiers. Discussed confidentiality: Yes   I discussed the limitations of telemedicine and the availability of in person appointments.  Discussed there is a possibility of technology failure and discussed alternative modes of communication if that failure occurs.  I discussed that engaging in this telemedicine visit, they consent to the provision of behavioral healthcare and the services will be billed under their insurance.  Patient and/or legal guardian expressed understanding and consented to Telemedicine visit: Yes   Presenting Concerns: Patient and/or family reports the following symptoms/concerns: *** Duration of problem: ***; Severity of problem: {Mild/Moderate/Severe:20260}  Patient and/or Family's Strengths/Protective Factors: {CHL AMB BH PROTECTIVE FACTORS:[phone]}  Goals Addressed: Patient will:  Reduce symptoms of: {IBH Symptoms:21014056}   Increase knowledge and/or ability of: {IBH Patient Tools:21014057}   Demonstrate ability to: {IBH Goals:21014053}  Progress towards Goals: {CHL AMB BH PROGRESS TOWARDS  GOALS:[phone]}    Interventions: Interventions utilized:  {IBH Interventions:21014054} Standardized Assessments completed: {IBH Screening Tools:21014051}    Patient and/or Family Response: Patient agrees with treatment plan.   Clinical Assessment/Diagnosis  No diagnosis found. .   Patient may benefit from psychoeducation and brief therapeutic interventions regarding coping with symptoms of *** .  Plan: Follow up with behavioral health clinician on : *** Behavioral recommendations:  -*** -*** Referral(s): {IBH Referrals:21014055}  I discussed the assessment and treatment plan with the patient and/or parent/guardian. They were provided an opportunity to ask questions and all were answered. They agreed with the plan and demonstrated an understanding of the instructions.   They were advised to call back or seek an in-person evaluation if the symptoms worsen or if the condition fails to improve as anticipated.  Warren JAYSON Mering, LCSW     09/19/2023    2:39 PM 09/03/2023   10:34 AM 11/15/2021   10:27 AM 10/06/2021    8:17 AM 08/04/2021   10:12 AM  Depression screen PHQ 2/9  Decreased Interest 0 0 0 2 0  Down, Depressed, Hopeless 0 0 0 2 1  PHQ - 2 Score 0 0 0 4 1  Altered sleeping 0 0 1 3 3   Tired, decreased energy 0 0 0 3 0  Change in appetite 0 0 1 2 0  Feeling bad or failure about yourself  0 0 0 0 1  Trouble concentrating 0 0 0 0 3  Moving slowly or fidgety/restless 0 0 0 0 0  Suicidal thoughts 0 0 0 0 0  PHQ-9 Score 0 0 2 12 8   Difficult doing work/chores    Very difficult       09/19/2023    2:39 PM 09/03/2023   10:35 AM 11/15/2021   10:26 AM 08/04/2021   10:14 AM  GAD 7 : Generalized Anxiety Score  Nervous, Anxious, on Edge 0 0  0 0  Control/stop worrying 0 0 0 0  Worry too much - different things 0 0 1 0  Trouble relaxing 0 0 0 0  Restless 0 0 0 0  Easily annoyed or irritable 0 0 3 3  Afraid - awful might happen 0 0 0 0  Total GAD 7 Score 0 0 4 3

## 2024-07-16 ENCOUNTER — Ambulatory Visit

## 2024-07-16 DIAGNOSIS — F4329 Adjustment disorder with other symptoms: Secondary | ICD-10-CM

## 2024-07-16 NOTE — Patient Instructions (Signed)
 [facility] [address] [place], [place] [address] [phone] (main office) [phone] [facility] office)  New Parent Support Groups [email or web address]

## 2024-09-18 ENCOUNTER — Ambulatory Visit (HOSPITAL_COMMUNITY)
Admission: EM | Admit: 2024-09-18 | Discharge: 2024-09-18 | Disposition: A | Discharge status: Home / Self Care | Attending: Nurse Practitioner | Admitting: Nurse Practitioner

## 2024-09-18 ENCOUNTER — Encounter (HOSPITAL_COMMUNITY): Payer: Self-pay

## 2024-09-18 DIAGNOSIS — G43019 Migraine without aura, intractable, without status migrainosus: Secondary | ICD-10-CM | POA: Diagnosis not present

## 2024-09-18 DIAGNOSIS — G4440 Drug-induced headache, not elsewhere classified, not intractable: Secondary | ICD-10-CM | POA: Diagnosis not present

## 2024-09-18 MED ORDER — SUMATRIPTAN SUCCINATE 6 MG/0.5ML ~~LOC~~ SOLN
6.0000 mg | Freq: Once | SUBCUTANEOUS | Status: AC
  Administered 2024-09-18: 6 mg via SUBCUTANEOUS

## 2024-09-18 MED ORDER — KETOROLAC TROMETHAMINE 30 MG/ML IJ SOLN
30.0000 mg | Freq: Once | INTRAMUSCULAR | Status: AC
  Administered 2024-09-18: 30 mg via INTRAMUSCULAR

## 2024-09-18 MED ORDER — SUMATRIPTAN SUCCINATE 6 MG/0.5ML ~~LOC~~ SOLN
SUBCUTANEOUS | Status: AC
  Filled 2024-09-18: qty 0.5

## 2024-09-18 MED ORDER — KETOROLAC TROMETHAMINE 30 MG/ML IJ SOLN
INTRAMUSCULAR | Status: AC
  Filled 2024-09-18: qty 1

## 2024-09-18 NOTE — Discharge Instructions (Signed)
 You were treated today for a migraine that matches the pattern of your usual headaches. You received sumatriptan  and Toradol  in the clinic, and these medications should help the migraine improve over the next several hours. It is normal to continue feeling sensitive to light or mildly tired as the headache settles. Resting in a quiet, dark room, drinking plenty of fluids, and avoiding screens for a few hours can help symptoms improve more quickly. If you feel dizzy when standing, rise slowly from sitting or lying positions.  Because your headaches seem to happen each time you take your blood pressure medication, it is important to follow up with your primary care provider to discuss changing this medication to something that will not trigger migraines and is safe while breastfeeding. Continue monitoring your baby as usual; nothing today suggests any risk to your infant.  Please schedule a follow-up with your primary care provider if your migraines become more frequent, if you continue to have headaches after taking your blood pressure medication, or if you need help adjusting your blood pressure treatment. Go to the emergency department if you develop a sudden or extremely severe headache, new weakness or numbness, trouble speaking, confusion, loss of vision, repeated vomiting, a stiff neck, chest pain, or if the headache does not improve after treatment.

## 2024-09-18 NOTE — ED Triage Notes (Signed)
 Patient having a headache that started this morning. Patient states she does have a history of Migraines onset after having her son in May. States tylenol  was working up until August. States she was seen in August and had to receive a migraine injection cocktail.   States the migraine is the same with otc meds not helping.

## 2024-09-18 NOTE — ED Provider Notes (Signed)
 [facility]    CSN: [id number] Arrival date & time: 09/18/24  1726      History   Chief Complaint Chief Complaint  Patient presents with   Headache    HPI Betty Logan is a 23 y.o. female.   Discussed the use of AI scribe software for clinical note transcription with the patient, who gave verbal consent to proceed.   The patient is a breastfeeding female who presents with a migraine that began this morning. The headache is consistent with her typical migraine pattern and has not improved with over-the-counter medications. She reports associated nausea without vomiting and light sensitivity. She experiences dizziness or lightheadedness only when standing. She denies facial, hand, or foot numbness or tingling, blurred vision, neck pain, or neck stiffness.  She is concerned that her migraine may be related to her blood pressure medication. She notes that each time she takes Nifedipine , she develops a severe migraine that makes it difficult to eat. She has a history of hypertension during pregnancy and has been taking Nifedipine  inconsistently. She is currently breastfeeding and supplementing with formula. She reports no history of bleeding problems or cardiac issues in her baby.  The following sections of the patient's history were reviewed and updated as appropriate: allergies, current medications, past family history, past medical history, past social history, past surgical history, and problem list.       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 Was also negative one year ago, but positive 11/2018.     04/06/2021: Called and left VM for Tammy Faucet, TB control [facility] to discuss patient diagnosis and treatment. Per Dr. Lola, with negative Quantiferon-Gold, pt does not  need any additional work-up for TB at this time.        Nausea and vomiting in pregnancy 03/30/2021   RX Diclegis  03/30/2021     Nightmares 10/08/2014   Supervision of high risk pregnancy, antepartum 03/23/2021              Nursing Staff    Provider      Office Location     [facility]    Dating     LMP      Language     English    Anatomy US      Normal but incomplete, f/u end of August 2022      Flu Vaccine     Received 07/08/21    Genetic/Carrier Screen     NIPS: Low risk female  AFP:  Not done  Horizon: neg      TDaP Vaccine      Declined 08/04/21    Hgb A1C or   GTT    Early   Third trimester       COVI   Supervision of other normal pregnancy, antepartum 09/03/2023              NURSING     PROVIDER      Office Location    [facility]    Dating by    LMP c/w U/S at 8 wks      [facility] Model    Traditional    Anatomy U/S           Initiated care at     4wks  Language     English                     LAB RESULTS       Support Person         Genetics    NIPS:   AFP:                 NT/IT (FT only)                     Carrier Screen    Horizon:           Patient Active Problem List   Diagnosis Date Noted   SVD (spontaneous vaginal delivery) 04/04/2024   Rubella non-immune status, antepartum 04/04/2024   Supervision of other normal pregnancy, antepartum 09/03/2023   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 Authorizing Provider  acetaminophen  (TYLENOL ) 325 MG tablet Take 2 tablets (650 mg total) by mouth every 6 (six) hours as needed. 10/27/21  Yes Dameron, Marisa, DO  NIFEdipine  (PROCARDIA  XL) 30 MG 24 hr tablet Take 1 tablet (30 mg total) by mouth daily. 05/07/24  Yes Warren-Hill, Camie LABOR, CNM  albuterol  (VENTOLIN  HFA) 108 (90 Base) MCG/ACT inhaler Inhale 2 puffs into the lungs every 4 (four) hours as needed for wheezing or shortness of breath. Patient not taking: No sig reported 05/21/20 10/14/20  Ettefagh, Mallie Hamilton, MD  budesonide -formoterol  (SYMBICORT ) 80-4.5 MCG/ACT inhaler Inhale 2 puffs into the lungs in the morning and at bedtime. Patient not taking: Reported on 10/14/2020 02/18/20 10/14/20  Gretel Andes, MD  cetirizine  (ZYRTEC ) 10 MG tablet Take 1 tablet (10 mg total) by mouth daily. Patient not taking: Reported on 10/14/2020 02/18/20 10/14/20  Gretel Andes, MD  traZODone  (DESYREL ) 50 MG tablet Take 1 tablet (50 mg total) by mouth at bedtime as needed for sleep. Patient not taking: Reported on 05/27/2020 02/18/20 10/14/20  Gretel Andes, MD    Family History Family History  Problem Relation Age of Onset   Hypertension Mother    Seizures Father     Social History Social History   Tobacco Use   Smoking status: Former    Types: Cigarettes   Smokeless tobacco: Never   Tobacco comments:    As a kid, stopped when a teenage  Vaping Use   Vaping status: Never Used  Substance Use Topics   Alcohol use: Not Currently    Comment: rarely   Drug use: Not Currently    Types: Marijuana    Comment: several months ago     Allergies   Cherry   Review of Systems Review of Systems  Constitutional:  Negative for fever.  Eyes:  Negative for photophobia and visual disturbance.  Gastrointestinal:  Positive for nausea. Negative for vomiting.  Musculoskeletal:  Negative for neck pain.  Neurological:  Positive for dizziness (only when standing)  and headaches. Negative for numbness.  All other systems reviewed and are negative.    Physical Exam Triage Vital Signs ED Triage Vitals  Encounter Vitals Group     BP 09/18/24 1909 (!) 126/90     Girls Systolic BP Percentile --      Girls Diastolic BP Percentile --      Boys Systolic BP Percentile --      Boys Diastolic BP Percentile --      Pulse Rate 09/18/24 1909 80     Resp 09/18/24 1909 18     Temp 09/18/24 1909 97.6 F (36.4 C)     Temp Source 09/18/24 1909 Oral     SpO2 09/18/24 1909 98 %     Weight --      Height 09/18/24 1909 5' 6 (1.676 m)     Head Circumference --      Peak Flow --      Pain Score 09/18/24 1908 10     Pain Loc --      Pain Education --      Exclude from Growth Chart --    No data found.  Updated Vital Signs BP (!) 126/90 (BP Location: Right Arm)   Pulse 80   Temp 97.6 F (36.4 C) (Oral)   Resp 18   Ht 5' 6 (1.676 m)   LMP  (LMP Unknown)   SpO2 98%   Breastfeeding Yes   BMI 20.34 kg/m   Visual Acuity Right Eye Distance:   Left Eye Distance:   Bilateral Distance:    Right Eye Near:   Left Eye Near:    Bilateral Near:     Physical Exam Vitals reviewed.  Constitutional:      General: She is awake. She is not in acute distress.    Appearance: Normal appearance. She is well-developed. She is not ill-appearing, toxic-appearing or diaphoretic.  HENT:     Head: Normocephalic.     Nose: Nose normal.     Mouth/Throat:     Mouth: Mucous membranes are moist.  Eyes:     General: Vision grossly intact.     Extraocular Movements: Extraocular movements intact.     Right eye: No nystagmus.     Left eye: No nystagmus.     Conjunctiva/sclera: Conjunctivae normal.  Neck:     Trachea: Trachea normal.     Meningeal: Brudzinski's sign and Kernig's sign absent.  Cardiovascular:     Rate and Rhythm: Normal rate.     Heart sounds: Normal heart sounds.  Pulmonary:     Effort: Pulmonary effort is normal.     Breath sounds: Normal breath  sounds.  Abdominal:     Palpations: Abdomen is soft.  Musculoskeletal:        General: Normal range of motion.     Cervical back: Normal range of motion and neck supple. No rigidity or tenderness.  Skin:    General: Skin is warm and dry.  Neurological:     General: No focal deficit present.     Mental Status: She is alert and oriented to person, place, and time.     Cranial Nerves: No cranial nerve deficit.     Sensory: Sensation is intact. No sensory deficit.     Motor: Motor function  is intact. No weakness.     Coordination: Coordination is intact.     Gait: Gait is intact.  Psychiatric:        Behavior: Behavior is cooperative.      UC Treatments / Results  Labs (all labs ordered are listed, but only abnormal results are displayed) Labs Reviewed - No data to display  EKG   Radiology No results found.  Procedures Procedures (including critical care time)  Medications Ordered in UC Medications  ketorolac  (TORADOL ) 30 MG/ML injection 30 mg (30 mg Intramuscular Given 09/18/24 1951)  SUMAtriptan  (IMITREX ) injection 6 mg (6 mg Subcutaneous Given 09/18/24 1951)    Initial Impression / Assessment and Plan / UC Course  I have reviewed the triage vital signs and the nursing notes.  Pertinent labs & imaging results that were available during my care of the patient were reviewed by me and considered in my medical decision making (see chart for details).    The patient presents with a migraine that began this morning, consistent with her typical migraine pattern and accompanied by nausea without vomiting, light sensitivity, and dizziness when standing. She denies neurologic symptoms such as numbness, tingling, vision changes, or neck stiffness. She is breastfeeding and supplementing with formula, and reports no concerning symptoms in her infant. Her history and symptom timing suggest a possible medication-induced component, as she states that each time she takes her blood  pressure medication, she develops a severe migraine shortly afterward. In the clinic, she was treated with sumatriptan  and Toradol  and monitored for response. She was advised to follow up with her primary care provider to evaluate alternative antihypertensive options that may better align with breastfeeding and migraine management. She was instructed to seek emergency care for new or worsening neurologic symptoms, severe or sudden headache, persistent vomiting, vision loss, difficulty speaking, weakness, or if her symptoms fail to improve with treatment.  Today's evaluation has revealed no signs of a dangerous process. Discussed diagnosis with patient and/or guardian. Patient and/or guardian aware of their diagnosis, possible red flag symptoms to watch out for and need for close follow up. Patient and/or guardian understands verbal and written discharge instructions. Patient and/or guardian comfortable with plan and disposition.  Patient and/or guardian has a clear mental status at this time, good insight into illness (after discussion and teaching) and has clear judgment to make decisions regarding their care  Documentation was completed with the aid of voice recognition software. Transcription may contain typographical errors.   Final Clinical Impressions(s) / UC Diagnoses   Final diagnoses:  Drug induced headache  Intractable migraine without aura and without status migrainosus     Discharge Instructions      You were treated today for a migraine that matches the pattern of your usual headaches. You received sumatriptan  and Toradol  in the clinic, and these medications should help the migraine improve over the next several hours. It is normal to continue feeling sensitive to light or mildly tired as the headache settles. Resting in a quiet, dark room, drinking plenty of fluids, and avoiding screens for a few hours can help symptoms improve more quickly. If you feel dizzy when standing, rise  slowly from sitting or lying positions.  Because your headaches seem to happen each time you take your blood pressure medication, it is important to follow up with your primary care provider to discuss changing this medication to something that will not trigger migraines and is safe while breastfeeding. Continue monitoring your baby as usual; nothing  today suggests any risk to your infant.  Please schedule a follow-up with your primary care provider if your migraines become more frequent, if you continue to have headaches after taking your blood pressure medication, or if you need help adjusting your blood pressure treatment. Go to the emergency department if you develop a sudden or extremely severe headache, new weakness or numbness, trouble speaking, confusion, loss of vision, repeated vomiting, a stiff neck, chest pain, or if the headache does not improve after treatment.      ED Prescriptions   None    PDMP not reviewed this encounter.   Iola Lukes, [place] 09/18/24 [phone]

## 2024-09-18 NOTE — ED Notes (Signed)
 Patient states that her transportation is here and she is unable to stay and wait for completion of visit and paperwork. Patient confirms that she does have mychart and will check paperwork and instructions there.   Patient informed should symptoms not resolve with medication tonight or worsen she should be seen in the ER. Patient verbalized understanding.   Provider informed.
